# Patient Record
Sex: Female | Born: 1980 | Race: White | Hispanic: No | Marital: Married | Smoking: Former smoker
Health system: Southern US, Community
[De-identification: ages and names within clinical notes are randomized; demographics above are authoritative.]

## PROBLEM LIST (undated history)

## (undated) DIAGNOSIS — R87629 Unspecified abnormal cytological findings in specimens from vagina: Secondary | ICD-10-CM

## (undated) DIAGNOSIS — F32A Depression, unspecified: Secondary | ICD-10-CM

## (undated) DIAGNOSIS — R87619 Unspecified abnormal cytological findings in specimens from cervix uteri: Secondary | ICD-10-CM

## (undated) DIAGNOSIS — F419 Anxiety disorder, unspecified: Secondary | ICD-10-CM

## (undated) DIAGNOSIS — Z6720 Type B blood, Rh positive: Secondary | ICD-10-CM

## (undated) DIAGNOSIS — IMO0002 Reserved for concepts with insufficient information to code with codable children: Secondary | ICD-10-CM

## (undated) DIAGNOSIS — T7840XA Allergy, unspecified, initial encounter: Secondary | ICD-10-CM

## (undated) DIAGNOSIS — Z8619 Personal history of other infectious and parasitic diseases: Secondary | ICD-10-CM

## (undated) DIAGNOSIS — F329 Major depressive disorder, single episode, unspecified: Secondary | ICD-10-CM

## (undated) DIAGNOSIS — K649 Unspecified hemorrhoids: Secondary | ICD-10-CM

## (undated) HISTORY — DX: Type B blood, Rh positive: Z67.20

## (undated) HISTORY — DX: Personal history of other infectious and parasitic diseases: Z86.19

## (undated) HISTORY — DX: Unspecified hemorrhoids: K64.9

## (undated) HISTORY — DX: Depression, unspecified: F32.A

## (undated) HISTORY — DX: Major depressive disorder, single episode, unspecified: F32.9

## (undated) HISTORY — DX: Unspecified abnormal cytological findings in specimens from cervix uteri: R87.619

## (undated) HISTORY — DX: Allergy, unspecified, initial encounter: T78.40XA

## (undated) HISTORY — DX: Anxiety disorder, unspecified: F41.9

## (undated) HISTORY — DX: Unspecified abnormal cytological findings in specimens from vagina: R87.629

---

## 2006-10-29 DIAGNOSIS — R87619 Unspecified abnormal cytological findings in specimens from cervix uteri: Secondary | ICD-10-CM

## 2006-10-29 HISTORY — DX: Unspecified abnormal cytological findings in specimens from cervix uteri: R87.619

## 2007-06-16 ENCOUNTER — Inpatient Hospital Stay (HOSPITAL_COMMUNITY): Admission: AD | Admit: 2007-06-16 | Discharge: 2007-06-19 | Payer: Self-pay | Admitting: Obstetrics and Gynecology

## 2007-11-30 HISTORY — PX: COLPOSCOPY: SHX161

## 2010-01-07 ENCOUNTER — Encounter: Admission: RE | Admit: 2010-01-07 | Discharge: 2010-01-07 | Payer: Self-pay | Admitting: Obstetrics and Gynecology

## 2010-11-09 ENCOUNTER — Encounter: Payer: Self-pay | Admitting: Family Medicine

## 2010-11-29 NOTE — L&D Delivery Note (Signed)
Delivery Note At 4:15 PM a viable and healthy female was delivered via  (Presentation:ROA ).  APGAR:4 ,8, 10 ; weight . 9 pounds and 2 ounces.  Placenta status:spontaneous and intact with 3 vessel Cord:  with the following complications:none .  Cord pH: 7.33. Loose nuchal cord x one reduced on perineum.  Anesthesia:  Epidural Episiotomy: none Lacerations: none Suture Repair: none Est. Blood Loss (mL): 500 Peds in attendance.  Mom to postpartum.  Baby to nursery-stable.  Barnabas Henriques J 10/19/2011, 4:31 PM

## 2010-12-03 ENCOUNTER — Ambulatory Visit
Admission: RE | Admit: 2010-12-03 | Discharge: 2010-12-03 | Payer: Self-pay | Source: Home / Self Care | Attending: Family Medicine | Admitting: Family Medicine

## 2010-12-03 ENCOUNTER — Encounter: Payer: Self-pay | Admitting: Family Medicine

## 2010-12-03 DIAGNOSIS — F411 Generalized anxiety disorder: Secondary | ICD-10-CM | POA: Insufficient documentation

## 2010-12-03 DIAGNOSIS — F418 Other specified anxiety disorders: Secondary | ICD-10-CM | POA: Insufficient documentation

## 2010-12-07 ENCOUNTER — Ambulatory Visit
Admission: RE | Admit: 2010-12-07 | Discharge: 2010-12-07 | Payer: Self-pay | Source: Home / Self Care | Attending: Family Medicine | Admitting: Family Medicine

## 2010-12-07 ENCOUNTER — Encounter: Payer: Self-pay | Admitting: Family Medicine

## 2010-12-07 ENCOUNTER — Encounter (INDEPENDENT_AMBULATORY_CARE_PROVIDER_SITE_OTHER): Payer: Self-pay | Admitting: *Deleted

## 2010-12-07 DIAGNOSIS — G4700 Insomnia, unspecified: Secondary | ICD-10-CM | POA: Insufficient documentation

## 2010-12-10 ENCOUNTER — Encounter
Admission: RE | Admit: 2010-12-10 | Discharge: 2010-12-10 | Payer: Self-pay | Source: Home / Self Care | Attending: Family Medicine | Admitting: Family Medicine

## 2010-12-10 ENCOUNTER — Telehealth: Payer: Self-pay | Admitting: Family Medicine

## 2010-12-10 ENCOUNTER — Encounter: Admission: RE | Admit: 2010-12-10 | Payer: Self-pay | Source: Home / Self Care | Admitting: Family Medicine

## 2010-12-14 ENCOUNTER — Telehealth: Payer: Self-pay | Admitting: Family Medicine

## 2010-12-30 ENCOUNTER — Encounter: Payer: Self-pay | Admitting: Family Medicine

## 2010-12-31 NOTE — Assessment & Plan Note (Signed)
Summary: depression, anxiety   Vital Signs:  Patient profile:   30 year old female Menstrual status:  regular Height:      63 inches Weight:      125 pounds Pulse rate:   88 / minute BP sitting:   114 / 74  (right arm) Cuff size:   regular  Vitals Entered By: Avon Gully CMA, Duncan Dull) (December 07, 2010 10:14 AM) CC: f/u mood,meds not working   Primary Care Provider:  Seymour Bars DO  CC:  f/u mood and meds not working.  History of Present Illness: patient or mood and insomnia.  She was given cervical 5 mg regular release by Dr. Orson Slick.  She never had that filled.  She met with the nurse practitioner who specializes in psychiatry and was given some samples of circle XRT 50 mg to take at bedtime.  She has been taking the Seroquel Xr 50mg  at bedtime. First night slept well and then second night didn't sleep well and then the next day felt better. thus she has been taking a circle for a total of 4 nights.  Two out of 4 she is slept well and two out of 4 she has not slept at all.  She is not really sure that it's calm her anxiety either.  This is the only med medication she is currently taking.   Feels her anxiety is really different from what  it used to be. She is very worried she may have a brain tumor.  she denies any other neurologic symptoms.  She does complain of feeling like she's getting shocking or shooting pains in her head especially at night. Says has never had insomnia until recently.  has tried Lexapr which helped in the past but didn't work this time (took it for 3 months).  Had tried Pristiq which worked well. But had to switch because of cost.  Tried trazodone for sleep but it took 200mg  to work and then would feel worse the next day.  Feels constantly restless.  Stopped everything over Thanksgiving and then tried Buspar.   I did review Dr. Ovidio Kin previous note.  Current Medications (verified): 1)  Klonopin 0.5 Mg Tabs (Clonazepam) .... Take 1-2 Tabs By Mouth At  Bedtime 2)  Seroquel 25 Mg Tabs (Quetiapine Fumarate) .Marland Kitchen.. 1 Tab By Mouth Qpm  Allergies (verified): 1)  ! Lexapro 2)  ! Zoloft 3)  ! Buspar  Comments:  Nurse/Medical Assistant: The patient's medications and allergies were reviewed with the patient and were updated in the Medication and Allergy Lists. Avon Gully CMA, Duncan Dull) (December 07, 2010 10:15 AM)  Physical Exam  General:  Well-developed,well-nourished,in no acute distress; alert,appropriate and cooperative throughout examination Head:  Normocephalic and atraumatic without obvious abnormalities. No apparent alopecia or balding. Lungs:  Normal respiratory effort, chest expands symmetrically. Lungs are clear to auscultation, no crackles or wheezes. Heart:  Normal rate and regular rhythm. S1 and S2 normal without gallop, murmur, click, rub or other extra sounds. Psych:  she is tearful today.  She does make eye contact and appears mildly anxious   Impression & Recommendations:  Problem # 1:  DEPRESSION, SEVERE (ICD-311)  OK for out of work for one week. Can drop off FMLA paperwork Use the klonopin sparingly.discussed abuse potential. PHQ-9 score of 20. GAD- 7 score of 19. she denies being actively suicidal. Asked that she continue the seroquel 50mg  XR and will start pristiq. she did very well on the prostate about two years ago.Samples given  for one week adn will send in rx. Will likely needs prior authorization. I really don't think this will be a problem since she is tried some medications since then. Pt is very concerned about a brain tumor. Dr. Cathey Endow and I tried to reassur her.  test if we can get her set up for a head CT that explained her that her insurance may deny this as she does not have any other significant neurologic symptoms at this time.  She has just had such a dramatic change in her mood without a significant explanation that she is worried this could be something else going on in her  brain l like a tumor Her  updated medication list for this problem includes:    Klonopin 0.5 Mg Tabs (Clonazepam) .Marland Kitchen... Take 1-2 tabs by mouth at bedtime    Pristiq 50 Mg Xr24h-tab (Desvenlafaxine succinate) .Marland Kitchen... Take 1 tablet by mouth once a day  Orders: T-CT Head w/o cm ([address])  Problem # 2:  ANXIETY STATE, UNSPECIFIED (ICD-300.00)  OK for out of work for one week. Can drop off FMLA paperwork Use the klonopin sparingly.discussed abuse potential. PHQ-9 score of 20. GAD- 7 score of 19 Asked that she continue the seroquel 50mg  XR and will start pristiq. she did very well on the prostate about two years ago.Samples given for one week adn will send in rx. Will likely needs prior authorization. I really don't think this will be a problem since she is tried some medications since then. Pt is very concerned about a brain tumor. Dr. Cathey Endow and I tried to reassur her.  test if we can get her set up for a head CT that explained her that her insurance may deny this as she does not have any other significant neurologic symptoms at this time.  She has just had such a dramatic change in her mood without a significant explanation that she is worried this could be something else going on in her  brain l like a tumor Her updated medication list for this problem includes:    Klonopin 0.5 Mg Tabs (Clonazepam) .Marland Kitchen... Take 1-2 tabs by mouth at bedtime    Pristiq 50 Mg Xr24h-tab (Desvenlafaxine succinate) .Marland Kitchen... Take 1 tablet by mouth once a day  Orders: T-CT Head w/o cm ([address])  Complete Medication List: 1)  Klonopin 0.5 Mg Tabs (Clonazepam) .... Take 1-2 tabs by mouth at bedtime 2)  Seroquel Xr 50 Mg Xr24h-tab (Quetiapine fumarate) .... Take 1 tablet by mouth once a day at bedtime 3)  Pristiq 50 Mg Xr24h-tab (Desvenlafaxine succinate) .... Take 1 tablet by mouth once a day  Patient Instructions: 1)  Please schedule a follow-up appointment in 3 weeks.  Prescriptions: PRISTIQ 50 MG XR24H-TAB (DESVENLAFAXINE SUCCINATE) Take 1 tablet by  mouth once a day  #30 x 0   Entered and Authorized by:   Nani Gasser MD   Signed by:   Nani Gasser MD on 12/07/2010   Method used:   Electronically to        [facility] [address] (retail)       [address]       [place], [place]  [address]       Ph: [phone]       Fax: [phone]   RxID:   [id number]    Orders Added: 1)  T-CT Head w/o cm [[address]] 2)  Est. Patient Level IV [[address]253]

## 2010-12-31 NOTE — Letter (Signed)
Summary: Out of Work  [facility]  [address], [address]   [place], [place] [address]   Phone: [phone]  Fax: [phone]    December 07, 2010   Employee:  Holly Medina    To Whom It May Concern:   For Medical reasons, please excuse the above named employee from work for the following dates:  Start:   12/07/10  End:   12/07/10  If you need additional information, please feel free to contact our office.         Sincerely,    Nani Gasser, MD

## 2010-12-31 NOTE — Letter (Signed)
Summary: Anxiety & Depression Questionnaire  Anxiety & Depression Questionnaire   Imported By: Lanelle Bal 12/23/2010 10:54:12  _____________________________________________________________________  External Attachment:    Type:   Image     Comment:   External Document

## 2010-12-31 NOTE — Progress Notes (Signed)
Summary: ? about Seroquel  Phone Note Call from Patient Call back at Home Phone [phone]   Caller: Patient Summary of Call: Pt states she is taking Seroquel 50mg  as prescribed, but it is only working every other night to help her sleep.  Pt states she took a klonipin she had on hand last night to help her sleep.  Pt would like to know what next step is since current dose of Seroquel is not working. Initial call taken by: Francee Piccolo CMA Duncan Dull),  December 14, 2010 11:19 AM  Follow-up for Phone Call        is she taking the Pristiq in the morning? If so, go ahead and increase Seroquel to 1 and a half tabs in the evening for a wk.  If still not sleeping everynight, go up to 2 tabs in the evening until her f/u appt with me.  If needs RF sooner, let me know.  Follow-up by: Seymour Bars DO,  December 14, 2010 11:32 AM  Additional Follow-up for Phone Call Additional follow up Details #1::        Pt states she is taking Pristiq in the morning.  She will increase to 1 and a half tablets in the evening to see if that will help her sleep everynight.  Pt understands to increase to 2 tablets nightly if 1 and a half tablets is not working.  Pt will call for refill if needed prior to follow up.  Additional Follow-up by: Francee Piccolo CMA Duncan Dull),  December 14, 2010 11:44 AM    New/Updated Medications: SEROQUEL 50 MG TABS (QUETIAPINE FUMARATE) 1.5 tabs by mouth qPM x 1 wk then increase to 2 tabs by mouth PM

## 2010-12-31 NOTE — Progress Notes (Signed)
Summary: ? on Seroquel  Phone Note Call from Patient Call back at Home Phone [phone]   Caller: Patient Call For: Seymour Bars DO Summary of Call: Pt states the Seroquel XR 50mg  samples do not seem to be working for her but the plain Seroquel 25mg  that you gave her first worked good. [facility]ses [facility] [address]. Pt states didn't know if you wanted to increase the extended release or what to do. Please advise Initial call taken by: Kathlene November LPN,  December 10, 2010 1:18 PM  Follow-up for Phone Call        I changed her to plain Seroquel 50 mg at night from the XR version.  Pls stop the XR version.   Follow-up by: Seymour Bars DO,  December 10, 2010 1:47 PM    New/Updated Medications: SEROQUEL 50 MG TABS (QUETIAPINE FUMARATE) 1 tab by mouth qPM Prescriptions: SEROQUEL 50 MG TABS (QUETIAPINE FUMARATE) 1 tab by mouth qPM  #60 x 1   Entered and Authorized by:   Seymour Bars DO   Signed by:   Seymour Bars DO on 12/10/2010   Method used:   Electronically to        [facility] [address] (retail)       [address]       [place], [place]  [address]       Ph: [phone]       Fax: [phone]   RxID:   [id number]   Appended Document: ? on Seroquel 12/10/2010- Pt notified of above instructions. KJ LPN

## 2010-12-31 NOTE — Letter (Signed)
Summary: Anxiety & Depression Questionnaire  Anxiety & Depression Questionnaire   Imported By: Lanelle Bal 12/18/2010 10:25:55  _____________________________________________________________________  External Attachment:    Type:   Image     Comment:   External Document

## 2010-12-31 NOTE — Assessment & Plan Note (Signed)
Summary: NOV mood   Vital Signs:  Patient profile:   30 year old female Menstrual status:  regular LMP:     11/09/2010 Height:      63 inches Weight:      126 pounds BMI:     22.40 O2 Sat:      98 % on Room air Temp:     98.3 degrees F oral Pulse rate:   68 / minute BP sitting:   109 / 66  (left arm) Cuff size:   regular  Vitals Entered By: Payton Spark CMA (December 03, 2010 9:49 AM)  O2 Flow:  Room air CC: New to est. Discuss insomnia, anxiety, restlessness and HAs LMP (date): 11/09/2010     Menstrual Status regular Enter LMP: 11/09/2010   Primary Care Provider:  Seymour Bars DO  CC:  New to est. Discuss insomnia, anxiety, and restlessness and HAs.  History of Present Illness: 30 yo WF presents for NOV.  She is a previously healthy G1P1.  She was treated for depression with Lexapro about 7 yrs ago for 1 yr and did well.  Postpartum, she was treated again with Lexapro which was then changed to Pristiq which worked even better but she could not afford the co pay.  She has been feeling depressed and anxious since Aug, shortly after moving to a new house.  Denies any other acute stressors.  She has not been sleeping well.  She went back to her previous PMD and was put back on Lexapro but it did not seem to help.  She then saw Donnie Aho, psychiatric NP and was tried on Buspar and the Zoloft which seemed to make her anxiety and insomnia worse.  She has a supportive husband and a 3 yr daughter, both doing well.  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  She screened neg for a thyroid d/o in Aug.  She is getting tension headaches from neck tightness.  Denies vision change, wt loss or vomitting.  Has slight nausea.  She is off OCPs in the hopes of having a 2nd child.  Periods are regular q 35 days.    she is currently taking Klonoopin at night which helps some but she says that she needs > 1 mg to rest.    Habits & Providers  Alcohol-Tobacco-Diet     Alcohol drinks/day:  <1     Tobacco Status: quit > 6 months     Year Quit: 2004  Exercise-Depression-Behavior     Have you felt down or hopeless? yes     STD Risk: never     Drug Use: never     Seat Belt Use: always  Current Medications (verified): 1)  Klonopin 0.5 Mg Tabs (Clonazepam) .... Take 1-2 Tabs By Mouth At Bedtime  Allergies (verified): 1)  ! Lexapro 2)  ! Zoloft 3)  ! 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  Has a 8 yo daughter. Quit smoking in 04. Used to exercise 1 ETOH/ wk Denies hx of drug use.Smoking Status:  quit > 6 months STD Risk:  never Drug Use:  never Seat Belt Use:  always  Review of Systems       no fevers/sweats/weakness, unexplained wt loss/gain, no change in vision, no difficulty hearing, ringing in ears, no hay fever/allergies, no CP/discomfort,  no palpitations, no breast lump/nipple discharge, no cough/wheeze, no blood in stool, no N/V/D, no nocturia, no leaking urine, no unusual vag bleeding, no vaginal/penile discharge, no muscle/joint pain, no rash, no new/changing mole, + HA, + memory loss, + anxiety, +sleep problem, + depression, no unexplained lumps, no easy bruising/bleeding, no concern with sexual function   Physical Exam  General:  alert, well-developed, well-nourished, and well-hydrated.   Head:  normocephalic and atraumatic.   Mouth:  good dentition and pharynx pink and moist.   Neck:  no masses.   Lungs:  normal respiratory effort, no intercostal retractions, and no accessory muscle use.   Heart:  normal rate, regular rhythm, and no murmur.   Extremities:  no pretibial edema Neurologic:  no tremor Skin:  color normal.   Psych:  good eye contact, depressed affect, tearful, and slightly anxious.     Impression &  Recommendations:  Problem # 1:  DEPRESSION, SEVERE (ICD-311) PHQ 9 score of 23 c/w severe depression.  Pt has tried mulitple SSRIs and 1 SNRI with SE of insomnia.  She is trying to concieve now. She will continue counseling.  Will work on treating her anxiety and insomnia (see below).  Pt verbally contracts that she will not try to harm herself or orthers and will call me for any problems.  RTC in 1 month for f/u.  May add Fluoxetine at that time. Her updated medication list for this problem includes:    Klonopin 0.5 Mg Tabs (Clonazepam) .Marland Kitchen... Take 1-2 tabs by mouth at bedtime  Problem # 2:  ANXIETY STATE, UNSPECIFIED (ICD-300.00) GAD score16 c/w severe anxiety with associated insomnia and HAs. Will start Seroquel at night for severe anxiety/ poor sleep/ resistant depression. Will start with 1/4 tab x 1 wk then increase to 1/2 tab x 1 wk then to 1 tab by mouth PM (at 7 pm).  Cautioned about sleepiniess. Will need to monitor wt, fasting glucose and lipids.  RTC in 4 wks for f/u.  Stop if + UPT. Her updated medication list for this problem includes:    Klonopin 0.5 Mg Tabs (Clonazepam) .Marland Kitchen... Take 1-2 tabs by mouth at bedtime  Complete Medication List: 1)  Klonopin 0.5 Mg Tabs (Clonazepam) .... Take 1-2 tabs by mouth at bedtime 2)  Seroquel 25 Mg Tabs (Quetiapine fumarate) .Marland Kitchen.. 1 tab by mouth qpm  Patient Instructions: 1)  Start SEROQUEL for anxiety and Insomnia. 2)  Stop the Klonopin. 3)  Take 1/4 tab of Seroquel at 7 pm for the first wk then go up to 1/2 tab of Seroquel at 7 pm for the 2nd wk.  If needed (and not causing too much daytime sedation), go up to a full tab at 7 pm during week 3. 4)  Call me if any problems. 5)  Stop if you get a + pregnancy test. 6)  REturn for follow up with labs in 4 wks. Prescriptions: SEROQUEL 25 MG TABS (QUETIAPINE FUMARATE) 1 tab by mouth qPM  #30 x 1   Entered and Authorized by:   Seymour Bars DO   Signed by:   Seymour Bars DO on 12/03/2010   Method  used:   Electronically to        [facility] [address] (retail)       [address]       [place], [place]  [address]       Ph: [phone]  Fax: [phone]   RxID:   [id number]    Orders Added: 1)  New Patient Level III [[address]]

## 2011-01-01 ENCOUNTER — Ambulatory Visit (INDEPENDENT_AMBULATORY_CARE_PROVIDER_SITE_OTHER): Payer: BC Managed Care – PPO | Admitting: Family Medicine

## 2011-01-01 ENCOUNTER — Ambulatory Visit: Admit: 2011-01-01 | Payer: Self-pay | Admitting: Family Medicine

## 2011-01-01 ENCOUNTER — Encounter: Payer: Self-pay | Admitting: Family Medicine

## 2011-01-01 DIAGNOSIS — F411 Generalized anxiety disorder: Secondary | ICD-10-CM

## 2011-01-01 DIAGNOSIS — F3289 Other specified depressive episodes: Secondary | ICD-10-CM

## 2011-01-01 DIAGNOSIS — F329 Major depressive disorder, single episode, unspecified: Secondary | ICD-10-CM

## 2011-01-06 NOTE — Miscellaneous (Signed)
Summary: old records  Clinical Lists Changes  Observations: Added new observation of PAST MED HX: depression/ anxiety- has tried zoloft, lexapro, pristiq and effexor, celexa, trazadone, xanax, klonopin (12/30/2010 9:57) Added new observation of PRIMARY MD: Seymour Bars DO (12/30/2010 9:57)       Past History:  Past Medical History: depression/ anxiety- has tried zoloft, lexapro, pristiq and effexor, celexa, trazadone, xanax, klonopin

## 2011-01-06 NOTE — Assessment & Plan Note (Signed)
Summary: f/u mood   Vital Signs:  Patient profile:   30 year old female Menstrual status:  regular Height:      63 inches Weight:      130 pounds BMI:     23.11 O2 Sat:      99 % on Room air Pulse rate:   82 / minute BP sitting:   114 / 72  (left arm) Cuff size:   regular  Vitals Entered By: Payton Spark CMA (January 01, 2011 3:55 PM)  O2 Flow:  Room air CC: F/U mood.   Primary Care Provider:  Seymour Bars DO  CC:  F/U mood.Holly Medina  History of Present Illness: 30 yo WF presents for f/u depression and anxiety.  She is doing better.  She is currently on Seroquel 50 mg in the evening and Pristiq 50 mg in the AM, rarely using klonopin and in counseling in [place] 1 x a wk.  She is making it to work and feeling more like herself.  Has decided to wait to try to conceive.  Husband notices a difference.  Still a bit anxious but is sleeping much better.  Still feels slightly shakey but improved.  Denies any suicidial ideations.    Current Medications (verified): 1)  Seroquel 50 Mg Tabs (Quetiapine Fumarate) .... 1.5 Tabs By Mouth Qpm X 1 Wk Then Increase To 2 Tabs By Mouth Pm 2)  Pristiq 50 Mg Xr24h-Tab (Desvenlafaxine Succinate) .... Take 1 Tablet By Mouth Once A Day  Allergies (verified): 1)  ! Lexapro 2)  ! Zoloft 3)  ! Buspar  Past History:  Past Medical History: Reviewed history from 12/30/2010 and no changes required. depression/ anxiety- has tried zoloft, lexapro, pristiq and effexor, celexa, trazadone, xanax, klonopin  Past Surgical History: Reviewed history from 12/03/2010 and no changes required. none  Social History: Reviewed history from 12/03/2010 and no changes required. 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  Has a 60 yo daughter. Quit smoking in 04. Used to exercise 1 ETOH/ wk Denies hx of drug use.  Review of Systems Psych:  Complains of anxiety and depression; denies alternate hallucination ( auditory/visual), easily  tearful, irritability, panic attacks, suicidal thoughts/plans, thoughts of violence, and unusual visions or sounds.  Physical Exam  General:  alert, well-developed, well-nourished, and well-hydrated.   Head:  normocephalic and atraumatic.   Neck:  no masses.   Lungs:  Normal respiratory effort, chest expands symmetrically. Lungs are clear to auscultation, no crackles or wheezes. Heart:  Normal rate and regular rhythm. S1 and S2 normal without gallop, murmur, click, rub or other extra sounds. Skin:  color normal.   Psych:  good eye contact, not depressed appearing, and slightly anxious.     Impression & Recommendations:  Problem # 1:  ANXIETY STATE, UNSPECIFIED (ICD-300.00) Improved but not quite at goal.  Sleep has improved wtih Seroquel and so has her anxiety but she still scored a 10 on a GAD 7 today. Will ask Dr Christell Constant to see her re: med mgmt.   The following medications were removed from the medication list:    Klonopin 0.5 Mg Tabs (Clonazepam) .Holly Medina... Take 1-2 tabs by mouth at bedtime Her updated medication list for this problem includes:    Pristiq 50 Mg Xr24h-tab (Desvenlafaxine succinate) .Holly Medina... Take 1 tablet by mouth once a day    Klonopin 1 Mg Tabs (Clonazepam) .Holly Medina... 1/2 to 1 tab by mouth two times a day as needed anxiety  Problem #  2:  DEPRESSION, SEVERE (ICD-311) PHQ 9 score is 6, much improved!  Will continue Pristiq at current dose along with counseling. The following medications were removed from the medication list:    Klonopin 0.5 Mg Tabs (Clonazepam) .Holly Medina... Take 1-2 tabs by mouth at bedtime Her updated medication list for this problem includes:    Pristiq 50 Mg Xr24h-tab (Desvenlafaxine succinate) .Holly Medina... Take 1 tablet by mouth once a day    Klonopin 1 Mg Tabs (Clonazepam) .Holly Medina... 1/2 to 1 tab by mouth two times a day as needed anxiety  Complete Medication List: 1)  Seroquel 50 Mg Tabs (Quetiapine fumarate) .Holly Medina.. 1 tab by mouth at bedtime 2)  Pristiq 50 Mg Xr24h-tab  (Desvenlafaxine succinate) .... Take 1 tablet by mouth once a day 3)  Klonopin 1 Mg Tabs (Clonazepam) .... 1/2 to 1 tab by mouth two times a day as needed anxiety  Other Orders: T-Comprehensive Metabolic Panel [id number]) T-Lipid Profile ([id number])  Patient Instructions: 1)  Stay on current meds, RFd. 2)  Update fasting labs the end of Feb, downstairs. 3)  Will call you w/ results. 4)  Continue counseling. 5)  Will put in for referral to Dr Christell Constant for med managment for anxiety. 6)  Return for f/u in 2 mos. Prescriptions: PRISTIQ 50 MG XR24H-TAB (DESVENLAFAXINE SUCCINATE) Take 1 tablet by mouth once a day  #30 x 3   Entered and Authorized by:   Seymour Bars DO   Signed by:   Seymour Bars DO on 01/01/2011   Method used:   Electronically to        [facility] [address] [phone]* (retail)       [address] [place], [place]  [address]       Ph: [phone]       Fax: [phone]   RxID:   [id number] SEROQUEL 50 MG TABS (QUETIAPINE FUMARATE) 1 tab by mouth at bedtime  #30 x 3   Entered and Authorized by:   Seymour Bars DO   Signed by:   Seymour Bars DO on 01/01/2011   Method used:   Electronically to        [facility] [id number] (retail)       [address] [place], [place]  [address]       Ph: [phone]       Fax: [phone]   RxID:   [id number]    Orders Added: 1)  T-Comprehensive Metabolic Panel [[id number]] 2)  T-Lipid Profile [[id number]] 3)  Est. Patient Level III [10175]

## 2011-01-13 ENCOUNTER — Telehealth: Payer: Self-pay | Admitting: Family Medicine

## 2011-01-14 NOTE — Letter (Signed)
Summary: Records from [facility] 2011  Records from [facility] 2011   Imported By: Maryln Gottron 01/06/2011 12:55:38  _____________________________________________________________________  External Attachment:    Type:   Image     Comment:   External Document

## 2011-01-20 NOTE — Progress Notes (Signed)
Summary: [facility]-Refill Klonopin  Phone Note Call from Patient Call back at Home Phone [phone]   Caller: Patient Call For: Seymour Bars DO Reason for Call: Talk to Nurse Details for Reason: Question about med Summary of Call: RC to pt.  LM to RC at home number  Initial call taken by: Francee Piccolo CMA Duncan Dull),  January 13, 2011 3:08 PM  Follow-up for Phone Call        pt was given klonopin by previous practice.  She is almost out and uses occasionally. Pt would like refill called to [facility]. Follow-up by: Francee Piccolo CMA Duncan Dull),  January 13, 2011 4:07 PM    Prescriptions: KLONOPIN 1 MG TABS (CLONAZEPAM) 1/2 to 1 tab by mouth two times a day as needed anxiety  #30 x 0   Entered and Authorized by:   Seymour Bars DO   Signed by:   Seymour Bars DO on 01/13/2011   Method used:   Print then Give to Patient   RxID:   [id number]   Appended Document: [facility]-Refill Klonopin Rx faxed. Pt awrae

## 2011-01-26 NOTE — Letter (Signed)
Summary: Depression & Anxiety Questionnaires  Depression & Anxiety Questionnaires   Imported By: Lanelle Bal 01/21/2011 11:17:09  _____________________________________________________________________  External Attachment:    Type:   Image     Comment:   External Document

## 2011-02-09 ENCOUNTER — Encounter: Payer: Self-pay | Admitting: Family Medicine

## 2011-03-11 ENCOUNTER — Encounter: Payer: Self-pay | Admitting: Family Medicine

## 2011-03-12 ENCOUNTER — Ambulatory Visit: Payer: BC Managed Care – PPO | Admitting: Family Medicine

## 2011-04-13 NOTE — H&P (Signed)
NAME:  Holly Medina, Holly Medina         ACCOUNT NO.:  [id number]   MEDICAL RECORD NO.:  [id number]          PATIENT TYPE:  INP   LOCATION:  9169                          FACILITY:  [facility]   PHYSICIAN:  Lenoard Aden, M.D.DATE OF BIRTH:  1981/08/14   DATE OF ADMISSION:  06/16/2007  DATE OF DISCHARGE:                              HISTORY & PHYSICAL   CHIEF COMPLAINT:  Presumed macrosomia for induction at 39 weeks.   She is a 30 year old white female, G1, P0, EDD of June 24, 2007, at 8  weeks' gestation who presents for cervical ripening and induction.   She has no known drug allergies.   MEDICATIONS:  Prenatal vitamins.   FAMILY HISTORY:  Multiple sclerosis, lung cancer, and chronic  hypertension.   She is a nonsmoker, nondrinker, she denies domestic or physical  violence.   She has a noncontributory surgical history.   PREGNANCY COURSE:  Complicated by size/dates discrepancy and Group B  strep positivity.   PHYSICAL EXAMINATION:  GENERAL:  She is a well-developed, well-  nourished, white female in no acute distress.  HEENT:  Normal.  LUNGS:  Clear.  HEART:  Regular rhythm.  ABDOMEN:  Soft, gravid, nontender.  Estimated fetal weight of 8.5  pounds.  PELVIC:  Cervix is 2-cm, thick, vertex, minus 1.  EXTREMITIES:  Reveal no cords.  NEUROLOGIC:  Nonfocal.  SKIN:  Intact.   Cervidil is placed.  NST is reactive.   IMPRESSION:  1. A 39-week intrauterine pregnancy.  2. Presumed macrosomia for induction.   PLAN:  Proceed with cervical ripening, induction, anticipate attempt at  vaginal delivery.  We will perform antepartum prophylaxis for Group B  strep.      Lenoard Aden, M.D.  Electronically Signed     RJT/MEDQ  D:  06/16/2007  T:  06/16/2007  Job:  [id number]

## 2011-05-11 ENCOUNTER — Other Ambulatory Visit (HOSPITAL_COMMUNITY): Payer: Self-pay | Admitting: Obstetrics and Gynecology

## 2011-05-26 ENCOUNTER — Ambulatory Visit (HOSPITAL_COMMUNITY)
Admission: RE | Admit: 2011-05-26 | Discharge: 2011-05-26 | Disposition: A | Discharge status: Home / Self Care | Payer: BC Managed Care – PPO | Source: Ambulatory Visit | Attending: Obstetrics and Gynecology | Admitting: Obstetrics and Gynecology

## 2011-05-26 ENCOUNTER — Ambulatory Visit (HOSPITAL_COMMUNITY): Admission: RE | Admit: 2011-05-26 | Payer: BC Managed Care – PPO | Source: Ambulatory Visit

## 2011-05-26 DIAGNOSIS — O358XX0 Maternal care for other (suspected) fetal abnormality and damage, not applicable or unspecified: Secondary | ICD-10-CM | POA: Insufficient documentation

## 2011-05-26 DIAGNOSIS — Z363 Encounter for antenatal screening for malformations: Secondary | ICD-10-CM | POA: Insufficient documentation

## 2011-05-26 DIAGNOSIS — Z1389 Encounter for screening for other disorder: Secondary | ICD-10-CM | POA: Insufficient documentation

## 2011-09-13 LAB — CBC
Hemoglobin: 12.3
MCHC: 34
MCV: 91.4
MCV: 92.5
Platelets: 217
RBC: 3.96
RDW: 12.9
RDW: 13
WBC: 11.4 — ABNORMAL HIGH

## 2011-10-18 ENCOUNTER — Encounter (HOSPITAL_COMMUNITY): Payer: Self-pay | Admitting: *Deleted

## 2011-10-18 ENCOUNTER — Inpatient Hospital Stay (HOSPITAL_COMMUNITY)
Admission: RE | Admit: 2011-10-18 | Discharge: 2011-10-21 | DRG: 373 | Disposition: A | Discharge status: Home / Self Care | Payer: BC Managed Care – PPO | Source: Ambulatory Visit | Attending: Obstetrics and Gynecology | Admitting: Obstetrics and Gynecology

## 2011-10-18 DIAGNOSIS — O99892 Other specified diseases and conditions complicating childbirth: Secondary | ICD-10-CM | POA: Diagnosis present

## 2011-10-18 DIAGNOSIS — O3660X0 Maternal care for excessive fetal growth, unspecified trimester, not applicable or unspecified: Secondary | ICD-10-CM | POA: Diagnosis present

## 2011-10-18 DIAGNOSIS — Z22330 Carrier of Group B streptococcus: Secondary | ICD-10-CM

## 2011-10-18 DIAGNOSIS — O409XX0 Polyhydramnios, unspecified trimester, not applicable or unspecified: Principal | ICD-10-CM | POA: Diagnosis present

## 2011-10-18 HISTORY — DX: Reserved for concepts with insufficient information to code with codable children: IMO0002

## 2011-10-18 LAB — CBC
HCT: 36.1 % (ref 36.0–46.0)
MCH: 29.1 pg (ref 26.0–34.0)
Platelets: 262 10*3/uL (ref 150–400)
RDW: 14.7 % (ref 11.5–15.5)

## 2011-10-18 LAB — ABO/RH: RH Type: POSITIVE

## 2011-10-18 LAB — ANTIBODY SCREEN: Antibody Screen: NEGATIVE

## 2011-10-18 LAB — HIV ANTIBODY (ROUTINE TESTING W REFLEX): HIV: NONREACTIVE

## 2011-10-18 MED ORDER — OXYTOCIN 20 UNITS IN LACTATED RINGERS INFUSION - SIMPLE
1.0000 m[IU]/min | INTRAVENOUS | Status: DC
  Administered 2011-10-19: 2 m[IU]/min via INTRAVENOUS
  Filled 2011-10-18: qty 1000

## 2011-10-18 MED ORDER — DINOPROSTONE 10 MG VA INST
10.0000 mg | VAGINAL_INSERT | Freq: Once | VAGINAL | Status: AC
  Administered 2011-10-18: 10 mg via VAGINAL
  Filled 2011-10-18: qty 1

## 2011-10-18 MED ORDER — TERBUTALINE SULFATE 1 MG/ML IJ SOLN: 0.2500 mg | Freq: Once | INTRAMUSCULAR | Status: AC | PRN

## 2011-10-18 MED ORDER — LIDOCAINE HCL (PF) 1 % IJ SOLN: 30.0000 mL | INTRAMUSCULAR | Status: DC | PRN

## 2011-10-18 MED ORDER — LACTATED RINGERS IV SOLN
500.0000 mL | INTRAVENOUS | Status: DC | PRN
  Administered 2011-10-19: 999 mL via INTRAVENOUS

## 2011-10-18 MED ORDER — PENICILLIN G POTASSIUM 5000000 UNITS IJ SOLR
5.0000 10*6.[IU] | Freq: Once | INTRAVENOUS | Status: AC
  Administered 2011-10-19: 5 10*6.[IU] via INTRAVENOUS
  Filled 2011-10-18: qty 5

## 2011-10-18 MED ORDER — ACETAMINOPHEN 325 MG PO TABS: 650.0000 mg | ORAL_TABLET | ORAL | Status: DC | PRN

## 2011-10-18 MED ORDER — OXYTOCIN 20 UNITS IN LACTATED RINGERS INFUSION - SIMPLE
125.0000 mL/h | Freq: Once | INTRAVENOUS | Status: AC
  Administered 2011-10-19: 999 mL/h via INTRAVENOUS

## 2011-10-18 MED ORDER — CITRIC ACID-SODIUM CITRATE 334-500 MG/5ML PO SOLN: 30.0000 mL | ORAL | Status: DC | PRN

## 2011-10-18 MED ORDER — PENICILLIN G POTASSIUM 5000000 UNITS IJ SOLR
2.5000 10*6.[IU] | INTRAVENOUS | Status: DC
  Administered 2011-10-19 (×2): 2.5 10*6.[IU] via INTRAVENOUS
  Filled 2011-10-18 (×5): qty 2.5

## 2011-10-18 MED ORDER — PENICILLIN G POTASSIUM 5000000 UNITS IJ SOLR: 2.5000 10*6.[IU] | INTRAVENOUS | Status: DC

## 2011-10-18 MED ORDER — ZOLPIDEM TARTRATE 10 MG PO TABS
10.0000 mg | ORAL_TABLET | Freq: Every evening | ORAL | Status: DC | PRN
  Administered 2011-10-18: 10 mg via ORAL
  Filled 2011-10-18: qty 1

## 2011-10-18 MED ORDER — ONDANSETRON HCL 4 MG/2ML IJ SOLN: 4.0000 mg | Freq: Four times a day (QID) | INTRAMUSCULAR | Status: DC | PRN

## 2011-10-18 MED ORDER — OXYTOCIN BOLUS FROM INFUSION
500.0000 mL | Freq: Once | INTRAVENOUS | Status: DC
  Filled 2011-10-18: qty 500

## 2011-10-18 MED ORDER — FLEET ENEMA 7-19 GM/118ML RE ENEM: 1.0000 | ENEMA | RECTAL | Status: DC | PRN

## 2011-10-18 MED ORDER — IBUPROFEN 600 MG PO TABS
600.0000 mg | ORAL_TABLET | Freq: Four times a day (QID) | ORAL | Status: DC | PRN
  Administered 2011-10-19: 600 mg via ORAL
  Filled 2011-10-18: qty 1

## 2011-10-18 MED ORDER — LACTATED RINGERS IV SOLN
INTRAVENOUS | Status: DC
  Administered 2011-10-19: 999 mL/h via INTRAVENOUS
  Administered 2011-10-19 (×2): via INTRAVENOUS

## 2011-10-18 MED ORDER — OXYCODONE-ACETAMINOPHEN 5-325 MG PO TABS: 2.0000 | ORAL_TABLET | ORAL | Status: DC | PRN

## 2011-10-18 NOTE — H&P (Signed)
NAMEMarland Medina  NANDI, TONNESEN NO.:  [id number]  MEDICAL RECORD NO.:  [id number]  LOCATION:  9173                          FACILITY:  [facility]  PHYSICIAN:  Lenoard Aden, M.D.DATE OF BIRTH:  1981-09-04  DATE OF ADMISSION:  10/18/2011 DATE OF DISCHARGE:                             HISTORY & PHYSICAL   CHIEF COMPLAINT:  Polyhydramnios with presumed LGA for induction.  HISTORY OF PRESENT ILLNESS:  A 30 year old white female, G2, P1, at 65 weeks' gestation admitted for aforementioned indications.  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  No other medical issues.  Medications are Zoloft and prenatal vitamins.  She is a nonsmoker, nondrinker.  She denies domestic or physical violence.  She has a family history of hypertension, multiple sclerosis, and lung cancer.  She has a history of an 8 pounds 7 ounce vaginal delivery in 2008.  PRENATAL COURSE:  Complicated by polyhydramnios with an AFI greater than 25 and presumed LGA with an estimated fetal weight greater than the 90th percentile and GBS positive.  PHYSICAL EXAMINATION:  GENERAL:  She is a well-developed, well- nourished, white female, in no acute distress. HEENT:  Normal. NECK:  Supple.  Full range of motion. LUNGS:  Clear. HEART:  Regular rhythm. ABDOMEN:  Soft, gravid, nontender.  Estimated fetal weight 9 pounds. Cervix:  1-2 cm, 50%, vertex, -1. EXTREMITIES:  No cords. NEUROLOGIC:  Nonfocal. SKIN:  Intact.  IMPRESSION:  This 39 weeks' intrauterine pregnancy with polyhydramnios, LGA, and GBS positive for induction.  PLAN:  Proceed with Cervidil and Pitocin in a.m.     Lenoard Aden, M.D.     RJT/MEDQ  D:  10/18/2011  T:  10/18/2011  Job:  [id number]

## 2011-10-18 NOTE — Progress Notes (Signed)
Holly Medina is a 30 y.o. G2P1001 at [redacted]w[redacted]d by LMP admitted for induction of labor due to Hydramnios.  Subjective:   Objective: BP 107/70  Pulse 96  Temp(Src) 97.8 F (36.6 C) (Oral)  Resp 18  Ht 5\' 3"  (1.6 m)  Wt 82.555 kg (182 lb)  BMI 32.24 kg/m2  LMP 01/18/2011      FHT:  FHR: 155 bpm, variability: moderate,  accelerations:  Present,  decelerations:  Absent UC:   irregular, every 10 minutes SVE:      Labs: Lab Results  Component Value Date   WBC 9.9 10/18/2011   HGB 11.9* 10/18/2011   HCT 36.1 10/18/2011   MCV 88.3 10/18/2011   PLT 262 10/18/2011    Assessment / Plan: 39 weeks Polyhydramnios LGA GBS positive  Labor: cervidil in Preeclampsia:  na Fetal Wellbeing:  Category I Pain Control:  Labor support without medications I/D:  n/a Anticipated MOD:  NSVD [id number]  Holly Medina J 10/18/2011, 9:11 PM

## 2011-10-19 ENCOUNTER — Encounter (HOSPITAL_COMMUNITY): Payer: Self-pay | Admitting: Anesthesiology

## 2011-10-19 ENCOUNTER — Encounter (HOSPITAL_COMMUNITY): Payer: Self-pay | Admitting: *Deleted

## 2011-10-19 ENCOUNTER — Inpatient Hospital Stay (HOSPITAL_COMMUNITY): Payer: BC Managed Care – PPO | Admitting: Anesthesiology

## 2011-10-19 LAB — RPR: RPR Ser Ql: NONREACTIVE

## 2011-10-19 MED ORDER — TETANUS-DIPHTH-ACELL PERTUSSIS 5-2.5-18.5 LF-MCG/0.5 IM SUSP
0.5000 mL | Freq: Once | INTRAMUSCULAR | Status: AC
  Administered 2011-10-20: 0.5 mL via INTRAMUSCULAR
  Filled 2011-10-19: qty 0.5

## 2011-10-19 MED ORDER — PHENYLEPHRINE 40 MCG/ML (10ML) SYRINGE FOR IV PUSH (FOR BLOOD PRESSURE SUPPORT)
80.0000 ug | PREFILLED_SYRINGE | INTRAVENOUS | Status: DC | PRN
  Filled 2011-10-19: qty 5

## 2011-10-19 MED ORDER — WITCH HAZEL-GLYCERIN EX PADS: 1.0000 "application " | MEDICATED_PAD | CUTANEOUS | Status: DC | PRN

## 2011-10-19 MED ORDER — OXYTOCIN 20 UNITS IN LACTATED RINGERS INFUSION - SIMPLE: 125.0000 mL/h | INTRAVENOUS | Status: DC

## 2011-10-19 MED ORDER — LIDOCAINE HCL 1.5 % IJ SOLN
INTRAMUSCULAR | Status: DC | PRN
  Administered 2011-10-19: 2 mL via INTRADERMAL
  Administered 2011-10-19 (×2): 5 mL via INTRADERMAL

## 2011-10-19 MED ORDER — PRENATAL PLUS 27-1 MG PO TABS
1.0000 | ORAL_TABLET | Freq: Every day | ORAL | Status: DC
  Administered 2011-10-20 – 2011-10-21 (×2): 1 via ORAL
  Filled 2011-10-19 (×2): qty 1

## 2011-10-19 MED ORDER — ONDANSETRON HCL 4 MG/2ML IJ SOLN: 4.0000 mg | INTRAMUSCULAR | Status: DC | PRN

## 2011-10-19 MED ORDER — FENTANYL 2.5 MCG/ML BUPIVACAINE 1/10 % EPIDURAL INFUSION (WH - ANES)
14.0000 mL/h | INTRAMUSCULAR | Status: DC
  Administered 2011-10-19: 14 mL/h via EPIDURAL
  Filled 2011-10-19 (×2): qty 60

## 2011-10-19 MED ORDER — FENTANYL 2.5 MCG/ML BUPIVACAINE 1/10 % EPIDURAL INFUSION (WH - ANES)
INTRAMUSCULAR | Status: DC | PRN
  Administered 2011-10-19: 14 mL/h via EPIDURAL

## 2011-10-19 MED ORDER — EPHEDRINE 5 MG/ML INJ
10.0000 mg | INTRAVENOUS | Status: DC | PRN
  Administered 2011-10-19: 10 mg via INTRAVENOUS
  Filled 2011-10-19: qty 4

## 2011-10-19 MED ORDER — EPHEDRINE 5 MG/ML INJ: 10.0000 mg | INTRAVENOUS | Status: DC | PRN

## 2011-10-19 MED ORDER — ONDANSETRON HCL 4 MG PO TABS: 4.0000 mg | ORAL_TABLET | ORAL | Status: DC | PRN

## 2011-10-19 MED ORDER — METHYLERGONOVINE MALEATE 0.2 MG/ML IJ SOLN: 0.2000 mg | INTRAMUSCULAR | Status: DC | PRN

## 2011-10-19 MED ORDER — DIBUCAINE 1 % RE OINT: 1.0000 "application " | TOPICAL_OINTMENT | RECTAL | Status: DC | PRN

## 2011-10-19 MED ORDER — LACTATED RINGERS IV SOLN: 500.0000 mL | Freq: Once | INTRAVENOUS | Status: DC

## 2011-10-19 MED ORDER — IBUPROFEN 600 MG PO TABS
600.0000 mg | ORAL_TABLET | Freq: Four times a day (QID) | ORAL | Status: DC
  Administered 2011-10-19 – 2011-10-21 (×6): 600 mg via ORAL
  Filled 2011-10-19 (×6): qty 1

## 2011-10-19 MED ORDER — METHYLERGONOVINE MALEATE 0.2 MG PO TABS: 0.2000 mg | ORAL_TABLET | ORAL | Status: DC | PRN

## 2011-10-19 MED ORDER — SERTRALINE HCL 50 MG PO TABS
150.0000 mg | ORAL_TABLET | Freq: Every day | ORAL | Status: DC
  Filled 2011-10-19 (×2): qty 1

## 2011-10-19 MED ORDER — LANOLIN HYDROUS EX OINT: TOPICAL_OINTMENT | CUTANEOUS | Status: DC | PRN

## 2011-10-19 MED ORDER — OXYCODONE-ACETAMINOPHEN 5-325 MG PO TABS
1.0000 | ORAL_TABLET | ORAL | Status: DC | PRN
Start: 2011-10-19 — End: 2011-10-21

## 2011-10-19 MED ORDER — ZOLPIDEM TARTRATE 5 MG PO TABS: 5.0000 mg | ORAL_TABLET | Freq: Every evening | ORAL | Status: DC | PRN

## 2011-10-19 MED ORDER — BENZOCAINE-MENTHOL 20-0.5 % EX AERO
1.0000 "application " | INHALATION_SPRAY | CUTANEOUS | Status: DC | PRN
  Administered 2011-10-20: 1 via TOPICAL

## 2011-10-19 MED ORDER — SIMETHICONE 80 MG PO CHEW: 80.0000 mg | CHEWABLE_TABLET | ORAL | Status: DC | PRN

## 2011-10-19 MED ORDER — DIPHENHYDRAMINE HCL 50 MG/ML IJ SOLN: 12.5000 mg | INTRAMUSCULAR | Status: DC | PRN

## 2011-10-19 MED ORDER — DIPHENHYDRAMINE HCL 25 MG PO CAPS: 25.0000 mg | ORAL_CAPSULE | Freq: Four times a day (QID) | ORAL | Status: DC | PRN

## 2011-10-19 MED ORDER — PHENYLEPHRINE 40 MCG/ML (10ML) SYRINGE FOR IV PUSH (FOR BLOOD PRESSURE SUPPORT): 80.0000 ug | PREFILLED_SYRINGE | INTRAVENOUS | Status: DC | PRN

## 2011-10-19 MED ORDER — SENNOSIDES-DOCUSATE SODIUM 8.6-50 MG PO TABS
2.0000 | ORAL_TABLET | Freq: Every day | ORAL | Status: DC
  Administered 2011-10-19 – 2011-10-20 (×2): 2 via ORAL

## 2011-10-19 NOTE — Progress Notes (Signed)
Informed of the admin of ephedrine, FHR tracing and current BPs.

## 2011-10-19 NOTE — Progress Notes (Signed)
Updated Dr Billy Coast on pt status, FHr tracing, SVE, and UCs,

## 2011-10-19 NOTE — Progress Notes (Signed)
DINORAH MASULLO is a 30 y.o. G2P1001 at [redacted]w[redacted]d by LMP admitted for induction of labor due to Hydramnios.  Subjective:   Objective: BP 100/72  Pulse 77  Temp(Src) 97.5 F (36.4 C) (Oral)  Resp 20  Ht 5\' 3"  (1.6 m)  Wt 82.555 kg (182 lb)  BMI 32.24 kg/m2  SpO2 99%  LMP 01/18/2011      FHT:  FHR: 155 bpm, variability: moderate,  accelerations:  Present,  decelerations:  Absent UC:   irregular, every 5 minutes SVE:   Dilation: 2 Effacement (%): 60 Station: -3 Exam by:: H.Norton, RN  AROM- clear  Labs: Lab Results  Component Value Date   WBC 9.9 10/18/2011   HGB 11.9* 10/18/2011   HCT 36.1 10/18/2011   MCV 88.3 10/18/2011   PLT 262 10/18/2011    Assessment / Plan: Induction of labor due to polyhydramnios,  progressing well on pitocin  Labor: Progressing on Pitocin, will continue to increase then AROM Preeclampsia:  na Fetal Wellbeing:  Category I Pain Control:  Labor support without medications I/D:  n/a Anticipated MOD:  NSVD  Zakariye Nee J 10/19/2011, 8:42 AM

## 2011-10-19 NOTE — Progress Notes (Signed)
Holly Medina is a 30 y.o. G2P1001 at [redacted]w[redacted]d by LMP admitted for induction of labor due to Hydramnios.  Subjective:   Objective: BP 121/57  Pulse 85  Temp(Src) 98.3 F (36.8 C) (Oral)  Resp 20  Ht 5\' 3"  (1.6 m)  Wt 82.555 kg (182 lb)  BMI 32.24 kg/m2  SpO2 99%  LMP 01/18/2011      FHT:  FHR: 155 bpm, variability: moderate,  accelerations:  Present,  decelerations:  Absent UC:   regular, every 3 minutes SVE:   4-5/80/-1 IUPC placed without difficulty  Labs: Lab Results  Component Value Date   WBC 9.9 10/18/2011   HGB 11.9* 10/18/2011   HCT 36.1 10/18/2011   MCV 88.3 10/18/2011   PLT 262 10/18/2011    Assessment / Plan: Induction of labor due to polyhydramnios,  progressing well on pitocin  Labor: Progressing normally Preeclampsia:  na Fetal Wellbeing:  Category I Pain Control:  Epidural I/D:  n/a Anticipated MOD:  NSVD  Holly Medina 10/19/2011, 1:50 PM

## 2011-10-19 NOTE — Anesthesia Preprocedure Evaluation (Signed)
Anesthesia Evaluation  Patient identified by MRN, date of birth, ID band Patient awake    Reviewed: Allergy & Precautions, H&P , NPO status , Patient's Chart, lab work & pertinent test results, reviewed documented beta blocker date and time   History of Anesthesia Complications Negative for: history of anesthetic complications  Airway Mallampati: II TM Distance: >3 FB Neck ROM: full    Dental  (+) Teeth Intact   Pulmonary  clear to auscultation        Cardiovascular neg cardio ROS regular Normal    Neuro/Psych PSYCHIATRIC DISORDERS (depression, anxiety) Negative Neurological ROS     GI/Hepatic negative GI ROS, Neg liver ROS,   Endo/Other  Negative Endocrine ROS  Renal/GU negative Renal ROS  Genitourinary negative   Musculoskeletal   Abdominal   Peds  Hematology negative hematology ROS (+)   Anesthesia Other Findings   Reproductive/Obstetrics (+) Pregnancy                           Anesthesia Physical Anesthesia Plan  ASA: II  Anesthesia Plan: Epidural   Post-op Pain Management:    Induction:   Airway Management Planned:   Additional Equipment:   Intra-op Plan:   Post-operative Plan:   Informed Consent: I have reviewed the patients History and Physical, chart, labs and discussed the procedure including the risks, benefits and alternatives for the proposed anesthesia with the patient or authorized representative who has indicated his/her understanding and acceptance.     Plan Discussed with:   Anesthesia Plan Comments:         Anesthesia Quick Evaluation

## 2011-10-19 NOTE — Anesthesia Procedure Notes (Signed)
Epidural Patient location during procedure: OB Start time: 10/19/2011 9:21 AM Reason for block: procedure for pain  Staffing Performed by: anesthesiologist   Preanesthetic Checklist Completed: patient identified, site marked, surgical consent, pre-op evaluation, timeout performed, IV checked, risks and benefits discussed and monitors and equipment checked  Epidural Patient position: sitting Prep: site prepped and draped and DuraPrep Patient monitoring: continuous pulse ox and blood pressure Approach: midline Injection technique: LOR air  Needle:  Needle type: Tuohy  Needle gauge: 17 G Needle length: 9 cm Needle insertion depth: 5 cm cm Catheter type: closed end flexible Catheter size: 19 Gauge Catheter at skin depth: 10 cm Test dose: negative  Assessment Events: blood not aspirated, injection not painful, no injection resistance, negative IV test and no paresthesia  Additional Notes Discussed risk of headache, infection, bleeding, nerve injury and failed or incomplete block.  Patient voices understanding and wishes to proceed.

## 2011-10-19 NOTE — Progress Notes (Signed)
Report given to Carlota Raspberry

## 2011-10-19 NOTE — Progress Notes (Signed)
Delivery of live viable female by Dr Billy Coast, NICU called at 1 min of delivery. APGARs 4,8, 10 Baby to c. nursery

## 2011-10-19 NOTE — Progress Notes (Signed)
Pt complains of nausea and dizziness.  

## 2011-10-20 ENCOUNTER — Encounter (HOSPITAL_COMMUNITY): Payer: Self-pay

## 2011-10-20 LAB — CBC
Hemoglobin: 11.2 g/dL — ABNORMAL LOW (ref 12.0–15.0)
MCHC: 33 g/dL (ref 30.0–36.0)
Platelets: 234 10*3/uL (ref 150–400)
RBC: 3.85 MIL/uL — ABNORMAL LOW (ref 3.87–5.11)

## 2011-10-20 MED ORDER — BENZOCAINE-MENTHOL 20-0.5 % EX AERO
INHALATION_SPRAY | CUTANEOUS | Status: AC
  Administered 2011-10-20: 1 via TOPICAL
  Filled 2011-10-20: qty 56

## 2011-10-20 NOTE — Progress Notes (Signed)
Patient ID: Holly Medina, female   DOB: 1981/08/22, 30 y.o.   MRN: [id number] Circumcision note: Parents counselled. Consent signed. Risks vs benefits of procedure discussed. Decreased risks of UTI, STDs and penile cancer noted. Time out done. Ring block with 1 ml 1% xylocaine without complications. Procedure with Gomco 1.3 without complications. EBL: minimal  Pt tolerated procedure well.

## 2011-10-20 NOTE — Progress Notes (Signed)
PPD 1 SVD  S:  Reports feeling well.             Tolerating po/ No nausea or vomiting             Bleeding is moderate             Pain controlled withprescription NSAID's including motrin.             Up ad lib / ambulatory  Newborn  Information for the patient's newborn:  Dhanvi, Boesen [[id number]]  female  breast feeding  / Circumcision in progress.   O:  A & O x 3 NAD             VS: Blood pressure 106/72, pulse 80, temperature 98.2 F (36.8 C), temperature source Oral, resp. rate 20, height 5\' 3"  (1.6 m), weight 82.555 kg (182 lb), last menstrual period 01/18/2011, SpO2 96.00%, unknown if currently breastfeeding.  LABS:  Basename 10/20/11 0540 10/18/11 1840  HGB 11.2* 11.9*  HCT 33.9* 36.1    I&O: I/O last 3 completed shifts: In: -  Out: 300 [Blood:300]      Lungs: Clear and unlabored  Heart: regular rate and rhythm / no mumurs  Abdomen: soft, non-tender, non-distended              Fundus: firm, non-tender, U@  Perineum: intact, no edema  Lochia: small  Extremities:  Trace pedal edema, no calf pain or tenderness, neg  Homans    A/P: PPD # 1 30 y.o., [id number] S/P:induced vaginal   Principal Problem:  *Postpartum care following vaginal delivery (11/20)   Doing well - stable status  Routine post partum orders  Anticipate discharge home in AM.   PAUL,DANIELA, CNM, MSN 10/20/2011, 9:42 AM

## 2011-10-20 NOTE — Progress Notes (Signed)

## 2011-10-20 NOTE — Anesthesia Postprocedure Evaluation (Signed)
  Anesthesia Post-op Note  Patient: Holly Medina  Procedure(s) Performed: * No procedures listed *  Patient Location: 140  Anesthesia Type: Epidural  Level of Consciousness: awake, alert  and oriented  Airway and Oxygen Therapy: Patient Spontanous Breathing  Post-op Pain: mild  Post-op Assessment: Post-op Vital signs reviewed, Patient's Cardiovascular Status Stable, No headache, No backache, No residual numbness and No residual motor weakness  Post-op Vital Signs: Reviewed and stable  Complications: No apparent anesthesia complications

## 2011-10-21 NOTE — Discharge Summary (Signed)
Obstetric Discharge Summary Reason for Admission: onset of labor Prenatal Procedures: none Intrapartum Procedures: spontaneous vaginal delivery Postpartum Procedures: none Complications-Operative and Postpartum: none Hemoglobin  Date Value Range Status  10/20/2011 11.2* 12.0-15.0 (g/dL) Final     HCT  Date Value Range Status  10/20/2011 33.9* 36.0-46.0 (%) Final    Discharge Diagnoses: Term Pregnancy-delivered  Discharge Information: Date: 10/21/2011 Activity: pelvic rest Diet: routine Medications: PNV and Ibuprofen and zoloft (100 mg) Condition: stable Instructions: refer to practice specific booklet Discharge to: home Follow-up Information    Follow up with Lenoard Aden, MD. Make an appointment in 6 weeks.   Contact information:   [address] [place] [address] [phone]          Newborn Data: Live born female  Birth Weight: 9 lb 1.9 oz (4136 g) APGAR: 4, 8  Home with mother.  Holly Medina 10/21/2011, 10:44 AM

## 2011-10-21 NOTE — Progress Notes (Signed)
Patient ID: Holly Medina, female   DOB: 1981-03-17, 30 y.o.   MRN: [id number]  PPD 2 SVD  S:  Reports feeling well             Tolerating po/ No nausea or vomiting             Bleeding is light             Pain controlled withprescription NSAID's including motrin             Up ad lib / ambulatory  Newborn breast feeding  / Circumcision done   O:  A & O x 3 NAD             VS: Blood pressure 100/65, pulse 76, temperature 97.8 F (36.6 C), temperature source Oral, resp. rate 18, height 5\' 3"  (1.6 m), weight 82.555 kg (182 lb), last menstrual period 01/18/2011, SpO2 97.00%, unknown if currently breastfeeding.  LABS: Lab Results  Component Value Date   WBC 13.7* 10/20/2011   HGB 11.2* 10/20/2011   HCT 33.9* 10/20/2011   MCV 88.1 10/20/2011   PLT 234 10/20/2011   Lungs: Clear and unlabored  Heart: regular rate and rhythm / no mumurs  Abdomen: soft, non-tender, non-distended              Fundus: firm, non-tender, U-1  Perineum: no edema  Lochia: light  Extremities: trace edema, no calf pain or tenderness    A: PPD # 2   Doing well - stable status  P:  Routine post partum orders  Discharge to home  [facility] 10/21/2011, 10:42 AM

## 2011-10-25 ENCOUNTER — Ambulatory Visit (HOSPITAL_COMMUNITY)
Admission: RE | Admit: 2011-10-25 | Discharge: 2011-10-25 | Disposition: A | Discharge status: Home / Self Care | Payer: BC Managed Care – PPO | Source: Ambulatory Visit | Attending: Obstetrics and Gynecology | Admitting: Obstetrics and Gynecology

## 2011-10-25 NOTE — Progress Notes (Signed)
Adult Lactation Consultation Outpatient Visit Note  Patient Name: Holly Medina Date of Birth: 1981/04/27 Gestational Age at Delivery: Unknown Type of Delivery:Per mom -  10/19/2011 Vaginal ,[place] boy Holly Medina  Birth wt = 9-2oz, D/c wt = 8-6oz, 11/24 Sat. Wt at Dr. Earmon [place] office = 8-9oz . Milk came in Holly Medina.  11/22 . Chief complaint today is "SORE NIPPLES , BOTH SIDES " , per mom the soreness started in the hospital and hasn't improved .   Breastfeeding History: Frequency of Breastfeeding: every 2-3 hours Length of Feeding: 10-20 mins or greater  Stools: 7 - yellow  , Voids - 7   Supplementing / Method: no supplementing , plenty of milk  Pumping:  Type of Pump: DEBP - Medela and a hand pump , per mom using PRN    Frequency:  Volume:    Comments:    Consultation Evaluation:  Initial Feeding Assessment: Pre-feed Weight:8-14oz ( 4026g)  Post-feed Weight:8-15.7oz ( 1610R)  Amount Transferred:30ml  Comments: Mom has plenty of milk ,breast full but not engorged. Prior to latching reviewed for latching ( breast  massage hand express , Reverse pressure exercise ,firm support , working to get "BABY Holly Medina " to open wide prior  to latch ( for a good size infant has a small mouth ) . As the [facility] assist with obtaining a deeper latch had mom apply pressure on breast tissue above infant mouth ,infant in a consistent pattern with swallows and gulps ( per mom much more comfortable ) . Holly Medina fed for .20 mins . With a 48ml yield indicated in a post weight . Infant very relaxed and sleeping after feeding .   Additional Feeding Assessment: Voided prior to 2nd breast  Pre-feed Weight: 8.15oz. (4060g)  Post-feed Weight: 8.15.4oz ( 4066g)  Amount Transferred:6 ml  Comments: Infant latched 2nd breast for 4-5 mins and ended feeding . Resting in moms arms content . Mom worked on obtaining depth 2nd breast and she stated I felt alittle pinching at first . Mom has so much milk and LC stressed the  importance of massage , hand express , pre pump with hand pump  To decrease fullness .   Additional Feeding Assessment: Pre-feed Weight: Post-feed Weight: Amount Transferred: Comments:  Total Breast milk Transferred this Visit:  Total Supplement Given:   Additional Interventions: Written plan of care given , stressing BASICS    Follow-Up PRN if soreness doesn't improve 4 days . Comfort gels with instructions .       Kathrin Greathouse 10/25/2011, 5:14 PM

## 2013-08-21 LAB — OB RESULTS CONSOLE GC/CHLAMYDIA
CHLAMYDIA, DNA PROBE: NEGATIVE
Gonorrhea: NEGATIVE

## 2013-08-21 LAB — OB RESULTS CONSOLE ANTIBODY SCREEN: ANTIBODY SCREEN: NEGATIVE

## 2013-08-21 LAB — OB RESULTS CONSOLE ABO/RH: RH TYPE: POSITIVE

## 2013-08-21 LAB — OB RESULTS CONSOLE RUBELLA ANTIBODY, IGM: RUBELLA: IMMUNE

## 2013-08-21 LAB — OB RESULTS CONSOLE HEPATITIS B SURFACE ANTIGEN: HEP B S AG: NEGATIVE

## 2013-08-21 LAB — OB RESULTS CONSOLE RPR: RPR: NONREACTIVE

## 2013-08-21 LAB — OB RESULTS CONSOLE HIV ANTIBODY (ROUTINE TESTING): HIV: NONREACTIVE

## 2013-09-05 ENCOUNTER — Inpatient Hospital Stay (HOSPITAL_COMMUNITY): Admission: AD | Admit: 2013-09-05 | Payer: Self-pay | Source: Ambulatory Visit | Admitting: Obstetrics and Gynecology

## 2013-11-29 NOTE — L&D Delivery Note (Signed)
Delivery Note At 11:50 PM a viable and healthy female was delivered via Vaginal, Spontaneous Delivery (Presentation: Left Occiput Anterior).  APGAR: 8, 9; weight pending.   Placenta status: Intact, Spontaneous.  Cord: 3 vessels with the following complications:none.  Cord pH: na  Anesthesia: Epidural  Episiotomy: None Lacerations: None Suture Repair: na Est. Blood Loss (mL): 100  Mom to postpartum.  Baby to Couplet care / Skin to Skin.  Bryli Mantey J Maria Gallicchio 03/24/2014, 11:59 PM

## 2014-02-25 LAB — OB RESULTS CONSOLE GBS: GBS: POSITIVE

## 2014-03-12 ENCOUNTER — Encounter (HOSPITAL_COMMUNITY): Payer: Self-pay | Admitting: *Deleted

## 2014-03-12 ENCOUNTER — Telehealth (HOSPITAL_COMMUNITY): Payer: Self-pay | Admitting: *Deleted

## 2014-03-12 NOTE — Telephone Encounter (Signed)
Preadmission screen  

## 2014-03-13 ENCOUNTER — Other Ambulatory Visit: Payer: Self-pay | Admitting: Obstetrics and Gynecology

## 2014-03-24 ENCOUNTER — Encounter (HOSPITAL_COMMUNITY): Payer: Self-pay

## 2014-03-24 ENCOUNTER — Encounter (HOSPITAL_COMMUNITY): Payer: 59 | Admitting: Anesthesiology

## 2014-03-24 ENCOUNTER — Inpatient Hospital Stay (HOSPITAL_COMMUNITY)
Admission: RE | Admit: 2014-03-24 | Discharge: 2014-03-26 | DRG: 775 | Disposition: A | Discharge status: Home / Self Care | Payer: 59 | Source: Ambulatory Visit | Attending: Obstetrics and Gynecology | Admitting: Obstetrics and Gynecology

## 2014-03-24 ENCOUNTER — Inpatient Hospital Stay (HOSPITAL_COMMUNITY): Payer: 59 | Admitting: Anesthesiology

## 2014-03-24 DIAGNOSIS — O409XX0 Polyhydramnios, unspecified trimester, not applicable or unspecified: Secondary | ICD-10-CM | POA: Diagnosis present

## 2014-03-24 DIAGNOSIS — O99344 Other mental disorders complicating childbirth: Secondary | ICD-10-CM | POA: Diagnosis present

## 2014-03-24 DIAGNOSIS — O99892 Other specified diseases and conditions complicating childbirth: Secondary | ICD-10-CM | POA: Diagnosis present

## 2014-03-24 DIAGNOSIS — D509 Iron deficiency anemia, unspecified: Secondary | ICD-10-CM | POA: Diagnosis present

## 2014-03-24 DIAGNOSIS — Z87891 Personal history of nicotine dependence: Secondary | ICD-10-CM

## 2014-03-24 DIAGNOSIS — Z8249 Family history of ischemic heart disease and other diseases of the circulatory system: Secondary | ICD-10-CM

## 2014-03-24 DIAGNOSIS — F341 Dysthymic disorder: Secondary | ICD-10-CM | POA: Diagnosis present

## 2014-03-24 DIAGNOSIS — Z22330 Carrier of Group B streptococcus: Secondary | ICD-10-CM

## 2014-03-24 DIAGNOSIS — O9989 Other specified diseases and conditions complicating pregnancy, childbirth and the puerperium: Secondary | ICD-10-CM

## 2014-03-24 DIAGNOSIS — O9902 Anemia complicating childbirth: Secondary | ICD-10-CM | POA: Diagnosis present

## 2014-03-24 DIAGNOSIS — O3660X0 Maternal care for excessive fetal growth, unspecified trimester, not applicable or unspecified: Principal | ICD-10-CM | POA: Diagnosis present

## 2014-03-24 LAB — CBC
HEMATOCRIT: 33 % — AB (ref 36.0–46.0)
Hemoglobin: 10.8 g/dL — ABNORMAL LOW (ref 12.0–15.0)
MCH: 27.6 pg (ref 26.0–34.0)
MCHC: 32.7 g/dL (ref 30.0–36.0)
MCV: 84.4 fL (ref 78.0–100.0)
PLATELETS: 241 10*3/uL (ref 150–400)
RBC: 3.91 MIL/uL (ref 3.87–5.11)
RDW: 13.8 % (ref 11.5–15.5)
WBC: 7.6 10*3/uL (ref 4.0–10.5)

## 2014-03-24 LAB — RPR

## 2014-03-24 LAB — TYPE AND SCREEN
ABO/RH(D): B POS
Antibody Screen: NEGATIVE

## 2014-03-24 LAB — ABO/RH: ABO/RH(D): B POS

## 2014-03-24 MED ORDER — PENICILLIN G POTASSIUM 5000000 UNITS IJ SOLR
5.0000 10*6.[IU] | Freq: Once | INTRAVENOUS | Status: AC
  Administered 2014-03-24: 5 10*6.[IU] via INTRAVENOUS
  Filled 2014-03-24: qty 5

## 2014-03-24 MED ORDER — TERBUTALINE SULFATE 1 MG/ML IJ SOLN: 0.2500 mg | Freq: Once | INTRAMUSCULAR | Status: AC | PRN

## 2014-03-24 MED ORDER — IBUPROFEN 600 MG PO TABS: 600.0000 mg | ORAL_TABLET | Freq: Four times a day (QID) | ORAL | Status: DC | PRN

## 2014-03-24 MED ORDER — OXYTOCIN 40 UNITS IN LACTATED RINGERS INFUSION - SIMPLE MED
62.5000 mL/h | INTRAVENOUS | Status: DC
  Filled 2014-03-24: qty 1000

## 2014-03-24 MED ORDER — PHENYLEPHRINE 40 MCG/ML (10ML) SYRINGE FOR IV PUSH (FOR BLOOD PRESSURE SUPPORT)
80.0000 ug | PREFILLED_SYRINGE | INTRAVENOUS | Status: DC | PRN
  Filled 2014-03-24: qty 2

## 2014-03-24 MED ORDER — LIDOCAINE HCL (PF) 1 % IJ SOLN
INTRAMUSCULAR | Status: DC | PRN
  Administered 2014-03-24 (×2): 4 mL

## 2014-03-24 MED ORDER — PHENYLEPHRINE 40 MCG/ML (10ML) SYRINGE FOR IV PUSH (FOR BLOOD PRESSURE SUPPORT)
PREFILLED_SYRINGE | INTRAVENOUS | Status: AC
  Filled 2014-03-24: qty 10

## 2014-03-24 MED ORDER — OXYTOCIN BOLUS FROM INFUSION
500.0000 mL | INTRAVENOUS | Status: DC
  Administered 2014-03-24: 500 mL via INTRAVENOUS

## 2014-03-24 MED ORDER — FLEET ENEMA 7-19 GM/118ML RE ENEM: 1.0000 | ENEMA | Freq: Once | RECTAL | Status: DC

## 2014-03-24 MED ORDER — DIPHENHYDRAMINE HCL 50 MG/ML IJ SOLN: 12.5000 mg | INTRAMUSCULAR | Status: DC | PRN

## 2014-03-24 MED ORDER — CITRIC ACID-SODIUM CITRATE 334-500 MG/5ML PO SOLN: 30.0000 mL | ORAL | Status: DC | PRN

## 2014-03-24 MED ORDER — EPHEDRINE 5 MG/ML INJ
10.0000 mg | INTRAVENOUS | Status: DC | PRN
  Filled 2014-03-24: qty 2

## 2014-03-24 MED ORDER — PENICILLIN G POTASSIUM 5000000 UNITS IJ SOLR
2.5000 10*6.[IU] | INTRAVENOUS | Status: DC
  Administered 2014-03-24 (×2): 2.5 10*6.[IU] via INTRAVENOUS
  Filled 2014-03-24 (×6): qty 2.5

## 2014-03-24 MED ORDER — OXYCODONE-ACETAMINOPHEN 5-325 MG PO TABS: 1.0000 | ORAL_TABLET | ORAL | Status: DC | PRN

## 2014-03-24 MED ORDER — LACTATED RINGERS IV SOLN: 500.0000 mL | Freq: Once | INTRAVENOUS | Status: DC

## 2014-03-24 MED ORDER — LIDOCAINE HCL (PF) 1 % IJ SOLN
30.0000 mL | INTRAMUSCULAR | Status: DC | PRN
  Filled 2014-03-24: qty 30

## 2014-03-24 MED ORDER — ONDANSETRON HCL 4 MG/2ML IJ SOLN: 4.0000 mg | Freq: Four times a day (QID) | INTRAMUSCULAR | Status: DC | PRN

## 2014-03-24 MED ORDER — ACETAMINOPHEN 325 MG PO TABS: 650.0000 mg | ORAL_TABLET | ORAL | Status: DC | PRN

## 2014-03-24 MED ORDER — FENTANYL 2.5 MCG/ML BUPIVACAINE 1/10 % EPIDURAL INFUSION (WH - ANES): 14.0000 mL/h | INTRAMUSCULAR | Status: DC | PRN

## 2014-03-24 MED ORDER — EPHEDRINE 5 MG/ML INJ
INTRAVENOUS | Status: AC
  Filled 2014-03-24: qty 4

## 2014-03-24 MED ORDER — FENTANYL 2.5 MCG/ML BUPIVACAINE 1/10 % EPIDURAL INFUSION (WH - ANES)
INTRAMUSCULAR | Status: AC
  Filled 2014-03-24: qty 125

## 2014-03-24 MED ORDER — LACTATED RINGERS IV SOLN: 500.0000 mL | INTRAVENOUS | Status: DC | PRN

## 2014-03-24 MED ORDER — OXYTOCIN 40 UNITS IN LACTATED RINGERS INFUSION - SIMPLE MED
1.0000 m[IU]/min | INTRAVENOUS | Status: DC
  Administered 2014-03-24: 2 m[IU]/min via INTRAVENOUS

## 2014-03-24 MED ORDER — FENTANYL 2.5 MCG/ML BUPIVACAINE 1/10 % EPIDURAL INFUSION (WH - ANES)
INTRAMUSCULAR | Status: DC | PRN
  Administered 2014-03-24: 14 mL/h via EPIDURAL

## 2014-03-24 MED ORDER — LACTATED RINGERS IV SOLN
INTRAVENOUS | Status: DC
  Administered 2014-03-24 (×2): via INTRAVENOUS

## 2014-03-24 NOTE — Anesthesia Procedure Notes (Signed)
Epidural Patient location during procedure: OB Start time: 03/24/2014 6:45 PM End time: 03/24/2014 6:55 PM  Staffing Anesthesiologist: Lewie LoronGERMEROTH, Tianne Plott R Performed by: anesthesiologist   Preanesthetic Checklist Completed: patient identified, pre-op evaluation, timeout performed, IV checked, risks and benefits discussed and monitors and equipment checked  Epidural Patient position: sitting Prep: site prepped and draped and DuraPrep Patient monitoring: heart rate Approach: midline Location: L2-L3 Injection technique: LOR air and LOR saline  Needle:  Needle type: Tuohy  Needle gauge: 17 G Needle length: 9 cm Needle insertion depth: 6 cm Catheter type: closed end flexible Catheter size: 19 Gauge Catheter at skin depth: 12 cm Test dose: negative  Assessment Sensory level: T8 Events: blood not aspirated, injection not painful, no injection resistance, negative IV test and no paresthesia  Additional Notes Reason for block:procedure for pain

## 2014-03-24 NOTE — Anesthesia Preprocedure Evaluation (Signed)
Anesthesia Evaluation  Patient identified by MRN, date of birth, ID band Patient awake    Reviewed: Allergy & Precautions, H&P , NPO status , Patient's Chart, lab work & pertinent test results, reviewed documented beta blocker date and time   History of Anesthesia Complications Negative for: history of anesthetic complications  Airway Mallampati: II TM Distance: >3 FB Neck ROM: full    Dental  (+) Teeth Intact   Pulmonary former smoker,  breath sounds clear to auscultation        Cardiovascular negative cardio ROS  Rhythm:regular Rate:Normal     Neuro/Psych PSYCHIATRIC DISORDERS (depression, anxiety) negative neurological ROS     GI/Hepatic negative GI ROS, Neg liver ROS,   Endo/Other  negative endocrine ROS  Renal/GU negative Renal ROS  negative genitourinary   Musculoskeletal   Abdominal   Peds  Hematology negative hematology ROS (+)   Anesthesia Other Findings   Reproductive/Obstetrics (+) Pregnancy                           Anesthesia Physical  Anesthesia Plan  ASA: II  Anesthesia Plan: Epidural   Post-op Pain Management:    Induction:   Airway Management Planned:   Additional Equipment:   Intra-op Plan:   Post-operative Plan:   Informed Consent: I have reviewed the patients History and Physical, chart, labs and discussed the procedure including the risks, benefits and alternatives for the proposed anesthesia with the patient or authorized representative who has indicated his/her understanding and acceptance.     Plan Discussed with:   Anesthesia Plan Comments:         Anesthesia Quick Evaluation

## 2014-03-24 NOTE — H&P (Signed)
Holly Medina is a 33 y.o. female presenting for induction due to LGA with poly. Maternal Medical History:  Contractions: Frequency: rare.   Perceived severity is mild.    Fetal activity: Perceived fetal activity is normal.   Last perceived fetal movement was within the past hour.    Prenatal complications: Polyhydramnios.   Prenatal Complications - Diabetes: none.    OB History   Grav Para Term Preterm Abortions TAB SAB Ect Mult Living   3 2 2  0 0 0 0 0 0 2     Past Medical History  Diagnosis Date  . Anxiety   . Abnormal Pap smear   . Asthma   . Postpartum care following vaginal delivery (11/20) 10/20/2011  . Vaginal Pap smear, abnormal    No past surgical history on file. Family History: family history includes Asthma in her mother; Cancer in her maternal grandfather; Hypertension in her father; Multiple sclerosis in her maternal grandmother. Social History:  reports that she quit smoking about 11 years ago. Her smoking use included Cigarettes. She smoked 0.00 packs per day. She does not have any smokeless tobacco history on file. She reports that she drinks about .5 ounces of alcohol per week. She reports that she does not use illicit drugs.   Prenatal Transfer Tool  Maternal Diabetes: No Genetic Screening: Normal Maternal Ultrasounds/Referrals: Normal Fetal Ultrasounds or other Referrals:  None Maternal Substance Abuse:  No Significant Maternal Medications:  None Significant Maternal Lab Results:  Lab values include: Group B Strep positive Other Comments:  None  Review of Systems  All other systems reviewed and are negative.     unknown if currently breastfeeding. Maternal Exam:  Uterine Assessment: Contraction strength is mild.  Contraction frequency is rare.   Abdomen: Patient reports no abdominal tenderness. Fetal presentation: vertex  Introitus: Normal vulva. Normal vagina.  Ferning test: not done.  Nitrazine test: not done. Amniotic fluid  character: not assessed.  Pelvis: adequate for delivery.   Cervix: Cervix evaluated by digital exam.     Physical Exam  Nursing note and vitals reviewed. Constitutional: She is oriented to person, place, and time. She appears well-developed and well-nourished.  HENT:  Head: Normocephalic.  Neck: Normal range of motion. Neck supple.  Cardiovascular: Normal rate, regular rhythm and normal heart sounds.   Respiratory: Effort normal and breath sounds normal.  GI: Soft. Bowel sounds are normal.  Genitourinary: Vagina normal and uterus normal.  Musculoskeletal: Normal range of motion.  Neurological: She is alert and oriented to person, place, and time. She has normal reflexes.  Skin: Skin is warm and dry.  Psychiatric: She has a normal mood and affect.    Prenatal labs: ABO, Rh: B/Positive/-- (09/23 0000) Antibody: Negative (09/23 0000) Rubella: Immune (09/23 0000) RPR: Nonreactive (09/23 0000)  HBsAg: Negative (09/23 0000)  HIV: Non-reactive (09/23 0000)  GBS: Positive (03/30 0000)   Assessment/Plan: LGA with poly for induction History of LGA GBS positive Proceed with Pitocin   Holly Medina 03/24/2014, 2:08 PM

## 2014-03-24 NOTE — Progress Notes (Signed)
Holly Medina is a 33 y.o. G3P2002 at 5422w0d by LMP admitted for induction of labor due to Hydramnios.  Subjective: Getting uncomfortable  Objective: BP 105/66  Pulse 76  Temp(Src) 98.5 F (36.9 C) (Oral)  Resp 19  Ht 5\' 3"  (1.6 m)  Wt 80.74 kg (178 lb)  BMI 31.54 kg/m2      FHT:  FHR: 145 bpm, variability: moderate,  accelerations:  Present,  decelerations:  Absent UC:   regular, every 2-3 minutes SVE:   Dilation: 3 Effacement (%): 70 Station: -2;-3 Exam by:: dr Billy Coasttaavon AROM- clear  Labs: Lab Results  Component Value Date   WBC 7.6 03/24/2014   HGB 10.8* 03/24/2014   HCT 33.0* 03/24/2014   MCV 84.4 03/24/2014   PLT 241 03/24/2014    Assessment / Plan: Induction of labor due to LGA with poly,  progressing well on pitocin  Labor: Progressing normally Preeclampsia:  no signs or symptoms of toxicity Fetal Wellbeing:  Category I Pain Control:  Labor support without medications I/D:  n/a Anticipated MOD:  NSVD  Holly Medina 03/24/2014, 6:28 PM

## 2014-03-25 ENCOUNTER — Encounter (HOSPITAL_COMMUNITY): Payer: Self-pay

## 2014-03-25 LAB — CBC
HEMATOCRIT: 32.8 % — AB (ref 36.0–46.0)
Hemoglobin: 10.6 g/dL — ABNORMAL LOW (ref 12.0–15.0)
MCH: 27.1 pg (ref 26.0–34.0)
MCHC: 32.3 g/dL (ref 30.0–36.0)
MCV: 83.9 fL (ref 78.0–100.0)
Platelets: 217 10*3/uL (ref 150–400)
RBC: 3.91 MIL/uL (ref 3.87–5.11)
RDW: 13.9 % (ref 11.5–15.5)
WBC: 12.7 10*3/uL — ABNORMAL HIGH (ref 4.0–10.5)

## 2014-03-25 MED ORDER — ZOLPIDEM TARTRATE 5 MG PO TABS: 5.0000 mg | ORAL_TABLET | Freq: Every evening | ORAL | Status: DC | PRN

## 2014-03-25 MED ORDER — LANOLIN HYDROUS EX OINT: TOPICAL_OINTMENT | CUTANEOUS | Status: DC | PRN

## 2014-03-25 MED ORDER — METHYLERGONOVINE MALEATE 0.2 MG PO TABS: 0.2000 mg | ORAL_TABLET | ORAL | Status: DC | PRN

## 2014-03-25 MED ORDER — DIPHENHYDRAMINE HCL 25 MG PO CAPS: 25.0000 mg | ORAL_CAPSULE | Freq: Four times a day (QID) | ORAL | Status: DC | PRN

## 2014-03-25 MED ORDER — ONDANSETRON HCL 4 MG PO TABS: 4.0000 mg | ORAL_TABLET | ORAL | Status: DC | PRN

## 2014-03-25 MED ORDER — ONDANSETRON HCL 4 MG/2ML IJ SOLN: 4.0000 mg | INTRAMUSCULAR | Status: DC | PRN

## 2014-03-25 MED ORDER — SERTRALINE HCL 100 MG PO TABS
150.0000 mg | ORAL_TABLET | Freq: Every day | ORAL | Status: DC
  Filled 2014-03-25 (×2): qty 1

## 2014-03-25 MED ORDER — METHYLERGONOVINE MALEATE 0.2 MG/ML IJ SOLN: 0.2000 mg | INTRAMUSCULAR | Status: DC | PRN

## 2014-03-25 MED ORDER — WITCH HAZEL-GLYCERIN EX PADS: 1.0000 "application " | MEDICATED_PAD | CUTANEOUS | Status: DC | PRN

## 2014-03-25 MED ORDER — IBUPROFEN 600 MG PO TABS
600.0000 mg | ORAL_TABLET | Freq: Four times a day (QID) | ORAL | Status: DC
  Administered 2014-03-25 – 2014-03-26 (×6): 600 mg via ORAL
  Filled 2014-03-25 (×6): qty 1

## 2014-03-25 MED ORDER — DIBUCAINE 1 % RE OINT
1.0000 | TOPICAL_OINTMENT | RECTAL | Status: DC | PRN
Start: 2014-03-25 — End: 2014-03-26

## 2014-03-25 MED ORDER — PRENATAL MULTIVITAMIN CH
1.0000 | ORAL_TABLET | Freq: Every day | ORAL | Status: DC
  Administered 2014-03-25 – 2014-03-26 (×2): 1 via ORAL
  Filled 2014-03-25 (×2): qty 1

## 2014-03-25 MED ORDER — OXYCODONE-ACETAMINOPHEN 5-325 MG PO TABS: 1.0000 | ORAL_TABLET | ORAL | Status: DC | PRN

## 2014-03-25 MED ORDER — SIMETHICONE 80 MG PO CHEW: 80.0000 mg | CHEWABLE_TABLET | ORAL | Status: DC | PRN

## 2014-03-25 MED ORDER — POLYSACCHARIDE IRON COMPLEX 150 MG PO CAPS
150.0000 mg | ORAL_CAPSULE | Freq: Every day | ORAL | Status: DC
  Administered 2014-03-25 – 2014-03-26 (×2): 150 mg via ORAL
  Filled 2014-03-25 (×2): qty 1

## 2014-03-25 MED ORDER — BENZOCAINE-MENTHOL 20-0.5 % EX AERO: 1.0000 "application " | INHALATION_SPRAY | CUTANEOUS | Status: DC | PRN

## 2014-03-25 MED ORDER — TETANUS-DIPHTH-ACELL PERTUSSIS 5-2.5-18.5 LF-MCG/0.5 IM SUSP: 0.5000 mL | Freq: Once | INTRAMUSCULAR | Status: DC

## 2014-03-25 MED ORDER — SENNOSIDES-DOCUSATE SODIUM 8.6-50 MG PO TABS
2.0000 | ORAL_TABLET | ORAL | Status: DC
  Administered 2014-03-26: 2 via ORAL
  Filled 2014-03-25: qty 2

## 2014-03-25 NOTE — Lactation Note (Signed)
This note was copied from the chart of Holly Holly Medina. Lactation Consultation Note: initial visit with this experienced BF mom. Reports that baby latched well after delivery but has been sleepy since. Was just circ'd and is asleep in grandmother's arms. Reviewed normal behavior the first 24 hours and after circ. Encouraged to call for assist if needed when baby wakes. Reports that she had very sore nipples the first few Shakesha Soltau breastfeeding her other babies. Reviewed wide open mouth and keeping the baby close to the breast throughout the feeding. BF brochure given with resources for support after DC. No questions at present. To call prn  Patient Name: Holly Medina Today's Date: 03/25/2014 Reason for consult: Initial assessment   Maternal Data Formula Feeding for Exclusion: No Infant to breast within first hour of birth: Yes Does the patient have breastfeeding experience prior to this delivery?: Yes  Feeding Feeding Type: Breast Fed Length of feed: 0 min  LATCH Score/Interventions Latch: Too sleepy or reluctant, no latch achieved, no sucking elicited.                    Lactation Tools Discussed/Used     Consult Status Consult Status: PRN    Pamelia HoitDonna D Jakiyah Stepney 03/25/2014, 11:51 AM

## 2014-03-25 NOTE — Progress Notes (Signed)
Patient was referred for history of depression/anxiety. * Referral screened out by Clinical Social Worker because none of the following criteria appear to apply: ~ History of anxiety/depression during this pregnancy, or of post-partum depression. ~ Diagnosis of anxiety and/or depression within last 3 years ~ History of depression due to pregnancy loss/loss of child OR * Patient's symptoms currently being treated with medication and/or therapy. Please contact the Clinical Social Worker if needs arise, or if patient requests.  Patient has Rx for Zoloft.

## 2014-03-25 NOTE — Anesthesia Postprocedure Evaluation (Signed)
  Anesthesia Post-op Note  Anesthesia Post Note  Patient: Holly Medina  Procedure(s) Performed: * No procedures listed *  Anesthesia type: Epidural  Patient location: Mother/Baby  Post pain: Pain level controlled  Post assessment: Post-op Vital signs reviewed  Last Vitals:  Filed Vitals:   03/25/14 0715  BP: 93/51  Pulse: 67  Temp: 36.7 C  Resp: 16    Post vital signs: Reviewed  Level of consciousness:alert  Complications: No apparent anesthesia complications

## 2014-03-25 NOTE — Progress Notes (Signed)
PPD #1- SVD  Subjective:   Reports feeling well, sore Tolerating po/ No nausea or vomiting Bleeding is light Pain controlled with Motrin Up ad lib / ambulatory / voiding without problems Newborn: breastfeeding  / Circumcision: planning   Objective:   VS:  VS:  Filed Vitals:   03/25/14 0208 03/25/14 0230 03/25/14 0330 03/25/14 0715  BP: 95/56 102/65 95/59 93/51   Pulse: 73 68 70 67  Temp:  98 F (36.7 C) 97.9 F (36.6 C) 98.1 F (36.7 C)  TempSrc:  Oral Oral Oral  Resp:  18 16 16   Height:      Weight:      SpO2:  97% 95% 97%    LABS:  Recent Labs  03/24/14 1430 03/25/14 0535  WBC 7.6 12.7*  HGB 10.8* 10.6*  PLT 241 217   Blood type: --/--/B POS, B POS (04/26 1430) Rubella: Immune (09/23 0000)   I&O: Intake/Output     04/26 0701 - 04/27 0700 04/27 0701 - 04/28 0700   Blood 100    Total Output 100     Net -100            Physical Exam: Alert and oriented x3 Abdomen: soft, non-tender, non-distended  Fundus: firm, non-tender, U-1 Perineum: intact Lochia: small Extremities: no edema, no calf pain or tenderness    Assessment:  PPD # 1G3P3003/ S/P:induced vaginal  IDA  Doing well    Plan: Continue routine post partum orders Start Niferex daily Anticipate D/C home tomorrow   Lawernce PittsMelanie N Greydon Betke MSN, CNM 03/25/2014, 9:02 AM

## 2014-03-26 MED ORDER — POLYSACCHARIDE IRON COMPLEX 150 MG PO CAPS: 150.0000 mg | ORAL_CAPSULE | Freq: Every day | ORAL | Status: DC

## 2014-03-26 MED ORDER — IBUPROFEN 600 MG PO TABS: 600.0000 mg | ORAL_TABLET | Freq: Four times a day (QID) | ORAL | Status: DC

## 2014-03-26 MED ORDER — LANOLIN HYDROUS EX OINT: 1.0000 "application " | TOPICAL_OINTMENT | CUTANEOUS | Status: DC | PRN

## 2014-03-26 NOTE — Progress Notes (Signed)
PPD #2- SVD  Subjective:   Reports feeling well, sore Tolerating po/ No nausea or vomiting Bleeding is light Pain controlled with Motrin Up ad lib / ambulatory / voiding without problems Newborn: breastfeeding - Lt nipple developing soreness possibly d/t poor latch / Circumcision: done   Objective:   VS:  VS:  Filed Vitals:   03/25/14 0330 03/25/14 0715 03/25/14 1749 03/26/14 0530  BP: 95/59 93/51 98/60  106/69  Pulse: 70 67 68 68  Temp: 97.9 F (36.6 C) 98.1 F (36.7 C) 98.2 F (36.8 C) 97.4 F (36.3 C)  TempSrc: Oral Oral Oral Oral  Resp: 16 16 18 18   Height:      Weight:      SpO2: 95% 97%      LABS:   Recent Labs  03/24/14 1430 03/25/14 0535  WBC 7.6 12.7*  HGB 10.8* 10.6*  PLT 241 217   Blood type: --/--/B POS, B POS (04/26 1430) Rubella: Immune (09/23 0000)   I&O: Intake/Output     04/27 0701 - 04/28 0700 04/28 0701 - 04/29 0700   Blood     Total Output       Net              Physical Exam: Alert and oriented x3 Abdomen: soft, non-tender, non-distended  Fundus: firm, non-tender, U-1 Perineum: intact Lochia: small Extremities: no edema, no calf pain or tenderness    Assessment:  PPD # 2 / G3P3003/ S/P:induced vaginal  IDA  Doing well    Plan: Continue routine post partum orders Continue Niferex daily D/C home today   Kenard Gowerolitta M Brionna Romanek MSN, CNM 03/26/2014, 9:03 AM

## 2014-03-26 NOTE — Discharge Summary (Signed)
Obstetric Discharge Summary Reason for Admission: induction of labor for LGA and polyhydramnios Prenatal Procedures: ultrasound Intrapartum Procedures: spontaneous vaginal delivery Postpartum Procedures: none Complications-Operative and Postpartum: none Hemoglobin  Date Value Ref Range Status  03/25/2014 10.6* 12.0 - 15.0 g/dL Final     HCT  Date Value Ref Range Status  03/25/2014 32.8* 36.0 - 46.0 % Final    Physical Exam:  General: alert, cooperative and no distress Lochia: appropriate Uterine Fundus: firm, midline, U-2 Perineum: intact DVT Evaluation: No evidence of DVT seen on physical exam. Negative Homan's sign. No cords or calf tenderness. No significant calf/ankle edema.  Labor Course: Patient was admitted to L&D for induction due to LGA with polyhydramnios / (+) GBS treatment during labor / progressed with Pitocin / SVD of viable female by Dr. Billy Coastaavon / No lacerations identified after delivery / no immediate postpartum complications   Discharge Diagnoses: Term Pregnancy-delivered        Polyhydramnios - delivered        LGA - delivered        H/O anxiety Discharge Information: Date: 03/26/2014 Activity: pelvic rest x 6 weeks Diet: routine Medications: PNV, Ibuprofen, Iron and Zoloft Condition: stable Instructions: refer to practice specific booklet Discharge to: home Follow-up Information   Follow up with Lenoard AdenAAVON,RICHARD J, MD. Schedule an appointment as soon as possible for a visit in 6 weeks.   Specialty:  Obstetrics and Gynecology   Contact information:   [address] [place] [id number] [phone]       Newborn Data: Live born female on 03/24/2014 Birth Weight: 9 lb 1.5 oz (4125 g) APGAR: 7, 9  Home with mother.  Holly Gowerolitta M Rubert Frediani, MSN, CNM 03/26/2014, 9:09 AM

## 2014-03-26 NOTE — Discharge Instructions (Signed)
Breast Pumping Tips °Pumping your breast milk is a good way to stimulate milk production and have a steady supply of breast milk for your infant. Pumping is most helpful during your infant's growth spurts, when involving dad or a family member, or when you are away. There are several types of pumps available. They can be purchased at a baby or maternity store. You can begin pumping soon after delivery, but some experts believe that you should wait about four weeks to give your infant a bottle. °In general, the more you breastfeed or pump, the more milk you will have for your infant. It is also important to take good care of yourself. This will reduce stress and help your body to create a healthy supply of milk. Your caregiver or lactation consultant can give you the information and support you need in your efforts to breastfeed your infant. °PUMPING BREAST MILK  °Follow the tips below for successful breast pumping. °Take care of yourself. °· Drink enough water or fluids to keep urine clear or pale yellow. You may notice a thirsty feeling while breastfeeding. This is because your body needs more water to make breast milk. Keep a large water bottle handy. Make healthy drink choices such as unsweetened fruit juice, milk and water. Limit soda, coffee, and alcohol (wait 2 hours to feed or pump if you have an alcoholic drink.) °· Eat a healthy, well-balanced diet rich in fruits, vegetables, and whole grains. °· Exercise as recommended by your caregiver. °· Get plenty of sleep. Sleep when your infant sleeps. Ask friends and family for help if you need time to nap or rest. °· Do not smoke. Smoking can lower your milk supply and harm your infant. If you need help quitting, ask your caregiver for a program recommendation. °· Ask your caregiver about birth control options. Birth control pills may lower your milk supply. You may be advised to use condoms or other forms of birth control. °Relax and pump °Stimulating your  let-down reflex is the key to successful and effective pumping. This makes the milk in all parts of the breast flow more freely.  °· It is easier to pump breast milk (and breastfeed) while you are relaxed. Find techniques that work for you. Quiet private spaces, breast massage, soothing heat placed on the breast, music, and pictures or a tape recording of your infant may help you to relax and "let down" your milk. If you have difficulty with your let down, try smelling one of your infant's blankets or an item of clothing he or she has worn while you are pumping. °· When pumping, place the special suction cup (flange) directly over the nipple. It may be uncomfortable and cause nipple damage if it is not placed properly or is the wrong size. Applying a small amount of purified or modified lanolin to your nipple and the areola may help increase your comfort level. Also, you can change the speed and suction of many electric pumps to your comfort level. Your caregiver or lactation consultant can help you with this. °· If pumping continues to be painful, or you feel you are not getting very much milk when you pump, you may need a different type of pump. A lactation consultant can help you determine if this is the case. °· If you are with your infant, feed him or her on demand and try pumping after each feeding. This will boost your production, even if milk does not come out. You may not be able   to pump much milk at first, but keep up the routine, and this will change. °· If you are working or away from your infant for several hours, try pumping for about 15 minutes every 2 to 3 hours. Pump both breasts at the same time if you can. °· If your infant has a formula feeding, make sure you pump your milk around the same time to maintain your supply. °· Begin pumping breast milk a few weeks before you return to work. This will help you develop techniques that work for you and will be able to store extra milk. °· Find a source  of breastfeeding information that works well for you. °TIPS FOR STORING BREAST MILK °· Store breast milk in a sealable sterile bag, jar, or container provided with your pumping supplies. °· Store milk in small amounts close to what your infant is drinking at each feeding. °· Cool pumped milk in a refrigerator or cooler. Pumped milk can last at the back of the refrigerator for 3 to 8 days. °· Place cooled milk at the back of the freezer for up to 3 months. °· Thaw the milk in its container or bag in warm water up to 24 hours in advance. Do not use a microwave to thaw or heat milk. Do not refreeze the milk after it has been thawed. °· Breast milk is safe to drink when left at room temperature (mid 70s or colder) for 4 to 8 hours. After that, throw it away. °· Milk fat can separate and look funny. The color can vary slightly from day to day. This is normal. Always shake the milk before using it to mix the fat with the more watery portion. °SEEK MEDICAL CARE IF:  °· You are having trouble pumping or feeding your infant. °· You are concerned that you are not making enough milk. °· You have nipple pain, soreness, or redness. °· You have other questions or concerns related to you or your infant. °Document Released: 05/05/2010 Document Revised: 02/07/2012 Document Reviewed: 05/05/2010 °[employer]® Patient Information ©2014 [employer]. °Postpartum Depression and Baby Blues °The postpartum period begins right after the birth of a baby. During this time, there is often a great amount of joy and excitement. It is also a time of considerable changes in the life of the parent(s). Regardless of how many times a mother gives birth, each child brings new challenges and dynamics to the family. It is not unusual to have feelings of excitement accompanied by confusing shifts in moods, emotions, and thoughts. All mothers are at risk of developing postpartum depression or the "baby blues." These mood changes can occur right after giving  birth, or they may occur many months after giving birth. The baby blues or postpartum depression can be mild or severe. Additionally, postpartum depression can resolve rather quickly, or it can be a long-term condition. °CAUSES °Elevated hormones and their rapid decline are thought to be a main cause of postpartum depression and the baby blues. There are a number of hormones that radically change during and after pregnancy. Estrogen and progesterone usually decrease immediately after delivering your baby. The level of thyroid hormone and various cortisol steroids also rapidly drop. Other factors that play a major role in these changes include major life events and genetics.  °RISK FACTORS °If you have any of the following risks for the baby blues or postpartum depression, know what symptoms to watch out for during the postpartum period. Risk factors that may increase the likelihood of   getting the baby blues or postpartum depression include: °· Having a personal or family history of depression. °· Having depression while being pregnant. °· Having premenstrual or oral contraceptive-associated mood issues. °· Having exceptional life stress. °· Having marital conflict. °· Lacking a social support network. °· Having a baby with special needs. °· Having health problems such as diabetes. °SYMPTOMS °Baby blues symptoms include: °· Brief fluctuations in mood, such as going from extreme happiness to sadness. °· Decreased concentration. °· Difficulty sleeping. °· Crying spells, tearfulness. °· Irritability. °· Anxiety. °Postpartum depression symptoms typically begin within the first month after giving birth. These symptoms include: °· Difficulty sleeping or excessive sleepiness. °· Marked weight loss. °· Agitation. °· Feelings of worthlessness. °· Lack of interest in activity or food. °Postpartum psychosis is a very concerning condition and can be dangerous. Fortunately, it is rare. Displaying any of the following symptoms is  cause for immediate medical attention. Postpartum psychosis symptoms include: °· Hallucinations and delusions. °· Bizarre or disorganized behavior. °· Confusion or disorientation. °DIAGNOSIS  °A diagnosis is made by an evaluation of your symptoms. There are no medical or lab tests that lead to a diagnosis, but there are various questionnaires that a caregiver may use to identify those with the baby blues, postpartum depression, or psychosis. Often times, a screening tool called the Edinburgh Postnatal Depression Scale is used to diagnose depression in the postpartum period.  °TREATMENT °The baby blues usually goes away on its own in 1 to 2 weeks. Social support is often all that is needed. You should be encouraged to get adequate sleep and rest. Occasionally, you may be given medicines to help you sleep.  °Postpartum depression requires treatment as it can last several months or longer if it is not treated. Treatment may include individual or group therapy, medicine, or both to address any social, physiological, and psychological factors that may play a role in the depression. Regular exercise, a healthy diet, rest, and social support may also be strongly recommended.  °Postpartum psychosis is more serious and needs treatment right away. Hospitalization is often needed. °HOME CARE INSTRUCTIONS °· Get as much rest as you can. Nap when the baby sleeps. °· Exercise regularly. Some women find yoga and walking to be beneficial. °· Eat a balanced and nourishing diet. °· Do little things that you enjoy. Have a cup of tea, take a bubble bath, read your favorite magazine, or listen to your favorite music. °· Avoid alcohol. °· Ask for help with household chores, cooking, grocery shopping, or running errands as needed. Do not try to do everything. °· Talk to people close to you about how you are feeling. Get support from your partner, family members, friends, or other new moms. °· Try to stay positive in how you think. Think  about the things you are grateful for. °· Do not spend a lot of time alone. °· Only take medicine as directed by your caregiver. °· Keep all your postpartum appointments. °· Let your caregiver know if you have any concerns. °SEEK MEDICAL CARE IF: °You are having a reaction or problems with your medicine. °SEEK IMMEDIATE MEDICAL CARE IF: °· You have suicidal feelings. °· You feel you may harm the baby or someone else. °Document Released: 08/19/2004 Document Revised: 02/07/2012 Document Reviewed: 08/27/2013 °[employer]® Patient Information ©2014 [employer]. °Breastfeeding and Mastitis °Mastitis is inflammation of the breast tissue. It can occur in women who are breastfeeding. This can make breastfeeding painful. Mastitis will sometimes go away on its own. Your   health care provider will help determine if treatment is needed. °CAUSES °Mastitis is often associated with a blocked milk (lactiferous) duct. This can happen when too much milk builds up in the breast. Causes of excess milk in the breast can include: °· Poor latch-on. If your baby is not latched onto the breast properly, she or he may not empty your breast completely while breastfeeding. °· Allowing too much time to pass between feedings. °· Wearing a bra or other clothing that is too tight. This puts extra pressure on the lactiferous ducts so milk does not flow through them as it should. °Mastitis can also be caused by a bacterial infection. Bacteria may enter the breast tissue through cuts or openings in the skin. In women who are breastfeeding, this may occur because of cracked or irritated skin. Cracks in the skin are often caused when your baby does not latch on properly to the breast. °SIGNS AND SYMPTOMS °· Swelling, redness, tenderness, and pain in an area of the breast. °· Swelling of the glands under the arm on the same side. °· Fever may or may not accompany mastitis. °If an infection is allowed to progress, a collection of pus (abscess) may  develop. °DIAGNOSIS  °Your health care provider can usually diagnose mastitis based on your symptoms and a physical exam. Tests may be done to help confirm the diagnosis. These may include: °· Removal of pus from the breast by applying pressure to the area. This pus can be examined in the lab to determine which bacteria are present. If an abscess has developed, the fluid in the abscess can be removed with a needle. This can also be used to confirm the diagnosis and determine the bacteria present. In most cases, pus will not be present. °· Blood tests to determine if your body is fighting a bacterial infection. °· Mammogram or ultrasound tests to rule out other problems or diseases. °TREATMENT  °Mastitis that occurs with breastfeeding will sometimes go away on its own. Your health care provider may choose to wait 24 hours after first seeing you to decide whether a prescription medicine is needed. If your symptoms are worse after 24 hours, your health care provider will likely prescribe an antibiotic to treat the mastitis. He or she will determine which bacteria are most likely causing the infection and will then select an appropriate antibiotic. This is sometimes changed based on the results of tests performed to identify the bacteria, or if there is no response to the antibiotic selected. Antibiotics are usually given by mouth. You may also be given medicine for pain. °HOME CARE INSTRUCTIONS °· Only take over-the-counter or prescription medicines for pain, fever, or discomfort as directed by your health care provider. °· If your health care provider prescribed an antibiotic, take the medicine as directed. Make sure you finish it even if you start to feel better. °· Do not wear a tight or underwire bra. Wear a soft, supportive bra. °· Increase your fluid intake, especially if you have a fever. °· Continue to empty the breast. Your health care provider can tell you whether this milk is safe for your infant or needs to  be thrown out. You may be told to stop nursing until your health care provider thinks it is safe for your baby. Use a breast pump if you are advised to stop nursing. °· Keep your nipples clean and dry. °· Empty the first breast completely before going to the other breast. If your baby is not emptying   your breasts completely for some reason, use a breast pump to empty your breasts. °· If you go back to work, pump your breasts while at work to stay in time with your nursing schedule. °· Avoid allowing your breasts to become overly filled with milk (engorged). °SEEK MEDICAL CARE IF: °· You have pus-like discharge from the breast. °· Your symptoms do not improve with the treatment prescribed by your health care provider within 2 days. °SEEK IMMEDIATE MEDICAL CARE IF: °· Your pain and swelling are getting worse. °· You have pain that is not controlled with medicine. °· You have a red line extending from the breast toward your armpit. °· You have a fever or persistent symptoms for more than 2 3 days. °· You have a fever and your symptoms suddenly get worse. °MAKE SURE YOU:  °· Understand these instructions. °· Will watch your condition. °· Will get help right away if you are not doing well or get worse. °Document Released: 03/12/2005 Document Revised: 07/18/2013 Document Reviewed: 06/21/2013 °[employer]® Patient Information ©2014 [employer]. ° °

## 2014-09-30 ENCOUNTER — Encounter (HOSPITAL_COMMUNITY): Payer: Self-pay

## 2015-11-06 ENCOUNTER — Ambulatory Visit (HOSPITAL_BASED_OUTPATIENT_CLINIC_OR_DEPARTMENT_OTHER)
Admission: RE | Admit: 2015-11-06 | Discharge: 2015-11-06 | Disposition: A | Discharge status: Home / Self Care | Payer: BLUE CROSS/BLUE SHIELD | Source: Ambulatory Visit | Attending: Family Medicine | Admitting: Family Medicine

## 2015-11-06 ENCOUNTER — Encounter: Payer: Self-pay | Admitting: Family Medicine

## 2015-11-06 ENCOUNTER — Ambulatory Visit (INDEPENDENT_AMBULATORY_CARE_PROVIDER_SITE_OTHER): Payer: BLUE CROSS/BLUE SHIELD | Admitting: Family Medicine

## 2015-11-06 VITALS — BP 110/76 | HR 70 | Temp 98.0°F | Resp 20 | Ht 64.5 in | Wt 141.8 lb

## 2015-11-06 DIAGNOSIS — Z0000 Encounter for general adult medical examination without abnormal findings: Secondary | ICD-10-CM | POA: Diagnosis not present

## 2015-11-06 DIAGNOSIS — Z23 Encounter for immunization: Secondary | ICD-10-CM

## 2015-11-06 DIAGNOSIS — Z79899 Other long term (current) drug therapy: Secondary | ICD-10-CM | POA: Insufficient documentation

## 2015-11-06 DIAGNOSIS — M25552 Pain in left hip: Secondary | ICD-10-CM | POA: Insufficient documentation

## 2015-11-06 DIAGNOSIS — M25559 Pain in unspecified hip: Secondary | ICD-10-CM | POA: Insufficient documentation

## 2015-11-06 LAB — CBC WITH DIFFERENTIAL/PLATELET
BASOS ABS: 0 10*3/uL (ref 0.0–0.1)
BASOS PCT: 0.5 % (ref 0.0–3.0)
Eosinophils Absolute: 0.1 10*3/uL (ref 0.0–0.7)
Eosinophils Relative: 1.6 % (ref 0.0–5.0)
HEMATOCRIT: 41 % (ref 36.0–46.0)
HEMOGLOBIN: 13.6 g/dL (ref 12.0–15.0)
LYMPHS PCT: 22.1 % (ref 12.0–46.0)
Lymphs Abs: 1.7 10*3/uL (ref 0.7–4.0)
MCHC: 33.2 g/dL (ref 30.0–36.0)
MCV: 92.4 fl (ref 78.0–100.0)
Monocytes Absolute: 0.5 10*3/uL (ref 0.1–1.0)
Monocytes Relative: 6.2 % (ref 3.0–12.0)
NEUTROS ABS: 5.5 10*3/uL (ref 1.4–7.7)
Neutrophils Relative %: 69.6 % (ref 43.0–77.0)
Platelets: 264 10*3/uL (ref 150.0–400.0)
RBC: 4.44 Mil/uL (ref 3.87–5.11)
RDW: 12.9 % (ref 11.5–15.5)
WBC: 7.9 10*3/uL (ref 4.0–10.5)

## 2015-11-06 LAB — COMPREHENSIVE METABOLIC PANEL
ALBUMIN: 4.3 g/dL (ref 3.5–5.2)
ALT: 10 U/L (ref 0–35)
AST: 15 U/L (ref 0–37)
Alkaline Phosphatase: 38 U/L — ABNORMAL LOW (ref 39–117)
BUN: 13 mg/dL (ref 6–23)
CALCIUM: 9.3 mg/dL (ref 8.4–10.5)
CO2: 25 meq/L (ref 19–32)
Chloride: 104 mEq/L (ref 96–112)
Creatinine, Ser: 0.58 mg/dL (ref 0.40–1.20)
GFR: 125.12 mL/min (ref >60.00)
GLUCOSE: 80 mg/dL (ref 70–99)
Potassium: 4.1 mEq/L (ref 3.5–5.1)
Sodium: 136 mEq/L (ref 135–145)
Total Bilirubin: 0.8 mg/dL (ref 0.2–1.2)
Total Protein: 6.6 g/dL (ref 6.0–8.3)

## 2015-11-06 NOTE — Patient Instructions (Addendum)
Femoroacetabular Impingement stretches. Hip xray as soon as possible orders placed.  Flu shot given today  Health Maintenance, Female Adopting a healthy lifestyle and getting preventive care can go a long way to promote health and wellness. Talk with your health care provider about what schedule of regular examinations is right for you. This is a good chance for you to check in with your provider about disease prevention and staying healthy. In between checkups, there are plenty of things you can do on your own. Experts have done a lot of research about which lifestyle changes and preventive measures are most likely to keep you healthy. Ask your health care provider for more information. WEIGHT AND DIET  Eat a healthy diet  Be sure to include plenty of vegetables, fruits, low-fat dairy products, and lean protein.  Do not eat a lot of foods high in solid fats, added sugars, or salt.  Get regular exercise. This is one of the most important things you can do for your health.  Most adults should exercise for at least 150 minutes each week. The exercise should increase your heart rate and make you sweat (moderate-intensity exercise).  Most adults should also do strengthening exercises at least twice a week. This is in addition to the moderate-intensity exercise.  Maintain a healthy weight  Body mass index (BMI) is a measurement that can be used to identify possible weight problems. It estimates body fat based on height and weight. Your health care provider can help determine your BMI and help you achieve or maintain a healthy weight.  For females 35 years of age and older:   A BMI below 18.5 is considered underweight.  A BMI of 18.5 to 24.9 is normal.  A BMI of 25 to 29.9 is considered overweight.  A BMI of 30 and above is considered obese.  Watch levels of cholesterol and blood lipids  You should start having your blood tested for lipids and cholesterol at 35 years of age, then have  this test every 5 years.  You may need to have your cholesterol levels checked more often if:  Your lipid or cholesterol levels are high.  You are older than 35 years of age.  You are at high risk for heart disease.  CANCER SCREENING   Lung Cancer  Lung cancer screening is recommended for adults 67-14 years old who are at high risk for lung cancer because of a history of smoking.  A yearly low-dose CT scan of the lungs is recommended for people who:  Currently smoke.  Have quit within the past 15 years.  Have at least a 30-pack-year history of smoking. A pack year is smoking an average of one pack of cigarettes a day for 1 year.  Yearly screening should continue until it has been 15 years since you quit.  Yearly screening should stop if you develop a health problem that would prevent you from having lung cancer treatment.  Breast Cancer  Practice breast self-awareness. This means understanding how your breasts normally appear and feel.  It also means doing regular breast self-exams. Let your health care provider know about any changes, no matter how small.  If you are in your 20s or 30s, you should have a clinical breast exam (CBE) by a health care provider every 1-3 years as part of a regular health exam.  If you are 56 or older, have a CBE every year. Also consider having a breast X-ray (mammogram) every year.  If you have a  family history of breast cancer, talk to your health care provider about genetic screening.  If you are at high risk for breast cancer, talk to your health care provider about having an MRI and a mammogram every year.  Breast cancer gene (BRCA) assessment is recommended for women who have family members with BRCA-related cancers. BRCA-related cancers include:  Breast.  Ovarian.  Tubal.  Peritoneal cancers.  Results of the assessment will determine the need for genetic counseling and BRCA1 and BRCA2 testing. Cervical Cancer Your health care  provider may recommend that you be screened regularly for cancer of the pelvic organs (ovaries, uterus, and vagina). This screening involves a pelvic examination, including checking for microscopic changes to the surface of your cervix (Pap test). You may be encouraged to have this screening done every 3 years, beginning at age 2.  For women ages 26-65, health care providers may recommend pelvic exams and Pap testing every 3 years, or they may recommend the Pap and pelvic exam, combined with testing for human papilloma virus (HPV), every 5 years. Some types of HPV increase your risk of cervical cancer. Testing for HPV may also be done on women of any age with unclear Pap test results.  Other health care providers may not recommend any screening for nonpregnant women who are considered low risk for pelvic cancer and who do not have symptoms. Ask your health care provider if a screening pelvic exam is right for you.  If you have had past treatment for cervical cancer or a condition that could lead to cancer, you need Pap tests and screening for cancer for at least 20 years after your treatment. If Pap tests have been discontinued, your risk factors (such as having a new sexual partner) need to be reassessed to determine if screening should resume. Some women have medical problems that increase the chance of getting cervical cancer. In these cases, your health care provider may recommend more frequent screening and Pap tests. Colorectal Cancer  This type of cancer can be detected and often prevented.  Routine colorectal cancer screening usually begins at 35 years of age and continues through 35 years of age.  Your health care provider may recommend screening at an earlier age if you have risk factors for colon cancer.  Your health care provider may also recommend using home test kits to check for hidden blood in the stool.  A small camera at the end of a tube can be used to examine your colon directly  (sigmoidoscopy or colonoscopy). This is done to check for the earliest forms of colorectal cancer.  Routine screening usually begins at age 63.  Direct examination of the colon should be repeated every 5-10 years through 35 years of age. However, you may need to be screened more often if early forms of precancerous polyps or small growths are found. Skin Cancer  Check your skin from head to toe regularly.  Tell your health care provider about any new moles or changes in moles, especially if there is a change in a mole's shape or color.  Also tell your health care provider if you have a mole that is larger than the size of a pencil eraser.  Always use sunscreen. Apply sunscreen liberally and repeatedly throughout the day.  Protect yourself by wearing long sleeves, pants, a wide-brimmed hat, and sunglasses whenever you are outside. HEART DISEASE, DIABETES, AND HIGH BLOOD PRESSURE   High blood pressure causes heart disease and increases the risk of stroke. High blood  pressure is more likely to develop in:  People who have blood pressure in the high end of the normal range (130-139/85-89 mm Hg).  People who are overweight or obese.  People who are African American.  If you are 47-57 years of age, have your blood pressure checked every 3-5 years. If you are 52 years of age or older, have your blood pressure checked every year. You should have your blood pressure measured twice--once when you are at a hospital or clinic, and once when you are not at a hospital or clinic. Record the average of the two measurements. To check your blood pressure when you are not at a hospital or clinic, you can use:  An automated blood pressure machine at a pharmacy.  A home blood pressure monitor.  If you are between 5 years and 26 years old, ask your health care provider if you should take aspirin to prevent strokes.  Have regular diabetes screenings. This involves taking a blood sample to check your  fasting blood sugar level.  If you are at a normal weight and have a low risk for diabetes, have this test once every three years after 35 years of age.  If you are overweight and have a high risk for diabetes, consider being tested at a younger age or more often. PREVENTING INFECTION  Hepatitis B  If you have a higher risk for hepatitis B, you should be screened for this virus. You are considered at high risk for hepatitis B if:  You were born in a country where hepatitis B is common. Ask your health care provider which countries are considered high risk.  Your parents were born in a high-risk country, and you have not been immunized against hepatitis B (hepatitis B vaccine).  You have HIV or AIDS.  You use needles to inject street drugs.  You live with someone who has hepatitis B.  You have had sex with someone who has hepatitis B.  You get hemodialysis treatment.  You take certain medicines for conditions, including cancer, organ transplantation, and autoimmune conditions. Hepatitis C  Blood testing is recommended for:  Everyone born from [birth year] through 1966.  Anyone with known risk factors for hepatitis C. Sexually transmitted infections (STIs)  You should be screened for sexually transmitted infections (STIs) including gonorrhea and chlamydia if:  You are sexually active and are younger than 35 years of age.  You are older than 35 years of age and your health care provider tells you that you are at risk for this type of infection.  Your sexual activity has changed since you were last screened and you are at an increased risk for chlamydia or gonorrhea. Ask your health care provider if you are at risk.  If you do not have HIV, but are at risk, it may be recommended that you take a prescription medicine daily to prevent HIV infection. This is called pre-exposure prophylaxis (PrEP). You are considered at risk if:  You are sexually active and do not regularly use condoms or  know the HIV status of your partner(s).  You take drugs by injection.  You are sexually active with a partner who has HIV. Talk with your health care provider about whether you are at high risk of being infected with HIV. If you choose to begin PrEP, you should first be tested for HIV. You should then be tested every 3 months for as long as you are taking PrEP.  PREGNANCY   If you are premenopausal  and you may become pregnant, ask your health care provider about preconception counseling.  If you may become pregnant, take 400 to 800 micrograms (mcg) of folic acid every day.  If you want to prevent pregnancy, talk to your health care provider about birth control (contraception). OSTEOPOROSIS AND MENOPAUSE   Osteoporosis is a disease in which the bones lose minerals and strength with aging. This can result in serious bone fractures. Your risk for osteoporosis can be identified using a bone density scan.  If you are 8 years of age or older, or if you are at risk for osteoporosis and fractures, ask your health care provider if you should be screened.  Ask your health care provider whether you should take a calcium or vitamin D supplement to lower your risk for osteoporosis.  Menopause may have certain physical symptoms and risks.  Hormone replacement therapy may reduce some of these symptoms and risks. Talk to your health care provider about whether hormone replacement therapy is right for you.  HOME CARE INSTRUCTIONS   Schedule regular health, dental, and eye exams.  Stay current with your immunizations.   Do not use any tobacco products including cigarettes, chewing tobacco, or electronic cigarettes.  If you are pregnant, do not drink alcohol.  If you are breastfeeding, limit how much and how often you drink alcohol.  Limit alcohol intake to no more than 1 drink per day for nonpregnant women. One drink equals 12 ounces of beer, 5 ounces of wine, or 1 ounces of hard liquor.  Do  not use street drugs.  Do not share needles.  Ask your health care provider for help if you need support or information about quitting drugs.  Tell your health care provider if you often feel depressed.  Tell your health care provider if you have ever been abused or do not feel safe at home.   This information is not intended to replace advice given to you by your health care provider. Make sure you discuss any questions you have with your health care provider.   Document Released: 05/31/2011 Document Revised: 12/06/2014 Document Reviewed: 10/17/2013 Elsevier Interactive Patient Education [employer].
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
## 2015-11-07 ENCOUNTER — Telehealth: Payer: Self-pay | Admitting: Family Medicine

## 2015-11-07 ENCOUNTER — Other Ambulatory Visit: Payer: Self-pay | Admitting: Family Medicine

## 2015-11-07 DIAGNOSIS — M25552 Pain in left hip: Secondary | ICD-10-CM

## 2015-11-07 NOTE — Telephone Encounter (Signed)
Spoke with patient reviewed lab and xray results and instructions. Patient verbalized understanding. 

## 2015-11-07 NOTE — Telephone Encounter (Signed)
Please call pt: - her labs are normal. - her hip xray is also so normal. This just rules out fractures etc, this does not rule out hip impingement or labral tear. The next step is to continue to with NSAIDS (Advil/aleve) and physical therapy to calm the inflammation and strengthen/align the hip. If that is not affective and pain continues or worsens, we will need to refer to ortho.

## 2015-11-07 NOTE — Telephone Encounter (Signed)
Left message for patient to return call.

## 2015-11-17 ENCOUNTER — Encounter: Payer: Self-pay | Admitting: Family Medicine

## 2016-03-18 DIAGNOSIS — F411 Generalized anxiety disorder: Secondary | ICD-10-CM | POA: Diagnosis not present

## 2016-03-25 DIAGNOSIS — F331 Major depressive disorder, recurrent, moderate: Secondary | ICD-10-CM | POA: Diagnosis not present

## 2016-06-03 DIAGNOSIS — F411 Generalized anxiety disorder: Secondary | ICD-10-CM | POA: Diagnosis not present

## 2016-06-23 DIAGNOSIS — Z01419 Encounter for gynecological examination (general) (routine) without abnormal findings: Secondary | ICD-10-CM | POA: Diagnosis not present

## 2016-06-23 DIAGNOSIS — Z6824 Body mass index (BMI) 24.0-24.9, adult: Secondary | ICD-10-CM | POA: Diagnosis not present

## 2016-06-23 DIAGNOSIS — Z1151 Encounter for screening for human papillomavirus (HPV): Secondary | ICD-10-CM | POA: Diagnosis not present

## 2016-06-23 DIAGNOSIS — R87613 High grade squamous intraepithelial lesion on cytologic smear of cervix (HGSIL): Secondary | ICD-10-CM | POA: Diagnosis not present

## 2016-10-01 DIAGNOSIS — H6691 Otitis media, unspecified, right ear: Secondary | ICD-10-CM | POA: Diagnosis not present

## 2016-10-01 DIAGNOSIS — R0981 Nasal congestion: Secondary | ICD-10-CM | POA: Diagnosis not present

## 2016-11-01 DIAGNOSIS — F411 Generalized anxiety disorder: Secondary | ICD-10-CM | POA: Diagnosis not present

## 2017-05-02 DIAGNOSIS — F411 Generalized anxiety disorder: Secondary | ICD-10-CM | POA: Diagnosis not present

## 2017-05-03 ENCOUNTER — Encounter: Payer: Self-pay | Admitting: Family Medicine

## 2017-05-03 ENCOUNTER — Ambulatory Visit (INDEPENDENT_AMBULATORY_CARE_PROVIDER_SITE_OTHER): Payer: BLUE CROSS/BLUE SHIELD | Admitting: Family Medicine

## 2017-05-03 VITALS — BP 103/69 | HR 54 | Temp 97.7°F | Resp 20 | Ht 65.0 in | Wt 142.0 lb

## 2017-05-03 DIAGNOSIS — Z79899 Other long term (current) drug therapy: Secondary | ICD-10-CM | POA: Diagnosis not present

## 2017-05-03 DIAGNOSIS — F418 Other specified anxiety disorders: Secondary | ICD-10-CM

## 2017-05-03 DIAGNOSIS — Z975 Presence of (intrauterine) contraceptive device: Secondary | ICD-10-CM | POA: Diagnosis not present

## 2017-05-03 DIAGNOSIS — Z0000 Encounter for general adult medical examination without abnormal findings: Secondary | ICD-10-CM

## 2017-05-03 LAB — COMPREHENSIVE METABOLIC PANEL
ALBUMIN: 4.7 g/dL (ref 3.5–5.2)
ALK PHOS: 40 U/L (ref 39–117)
ALT: 10 U/L (ref 0–35)
AST: 17 U/L (ref 0–37)
BUN: 12 mg/dL (ref 6–23)
CO2: 28 mEq/L (ref 19–32)
Calcium: 9.3 mg/dL (ref 8.4–10.5)
Chloride: 104 mEq/L (ref 96–112)
Creatinine, Ser: 0.69 mg/dL (ref 0.40–1.20)
GFR: 101.56 mL/min (ref >60.00)
GLUCOSE: 84 mg/dL (ref 70–99)
POTASSIUM: 4 meq/L (ref 3.5–5.1)
SODIUM: 138 meq/L (ref 135–145)
TOTAL PROTEIN: 6.9 g/dL (ref 6.0–8.3)
Total Bilirubin: 1.1 mg/dL (ref 0.2–1.2)

## 2017-05-03 LAB — CBC WITH DIFFERENTIAL/PLATELET
BASOS PCT: 0.9 % (ref 0.0–3.0)
Basophils Absolute: 0 10*3/uL (ref 0.0–0.1)
EOS PCT: 4 % (ref 0.0–5.0)
Eosinophils Absolute: 0.2 10*3/uL (ref 0.0–0.7)
HCT: 39.5 % (ref 36.0–46.0)
Hemoglobin: 13.1 g/dL (ref 12.0–15.0)
LYMPHS ABS: 1.6 10*3/uL (ref 0.7–4.0)
Lymphocytes Relative: 34.3 % (ref 12.0–46.0)
MCHC: 33.3 g/dL (ref 30.0–36.0)
MCV: 93 fl (ref 78.0–100.0)
MONO ABS: 0.3 10*3/uL (ref 0.1–1.0)
MONOS PCT: 6.7 % (ref 3.0–12.0)
NEUTROS PCT: 54.1 % (ref 43.0–77.0)
Neutro Abs: 2.5 10*3/uL (ref 1.4–7.7)
Platelets: 284 10*3/uL (ref 150.0–400.0)
RBC: 4.24 Mil/uL (ref 3.87–5.11)
RDW: 12.9 % (ref 11.5–15.5)
WBC: 4.7 10*3/uL (ref 4.0–10.5)

## 2017-05-03 NOTE — Progress Notes (Signed)
Subjective:    Patient ID: Holly Medina, female    DOB: 1981-04-25, 36 y.o.   MRN: [id number]  Patient Care Team    Relationship Specialty Notifications Start End  Ma Hillock, DO PCP - General Family Medicine  11/06/15   Brien Few, MD Consulting Physician Obstetrics and Gynecology  05/03/17     Chief Complaint  Patient presents with  . Annual Exam    HPI  Holly Medina is 36 y.o. female present for CPE today. Patient denies any complaints.  Depression with anxiety: Patient has been stable with her depression and anxiety on Zoloft 200 mg daily. She has had a change in her psychiatry provider secondary to her prior psychiatrist changing positions. She has had one appointment with her new provider [name], FNP. She so far feels like this is a good fit for her and has routine follow-up schedule. Zoloft medications are provided through her psychiatrist.  Health maintenance:  Colonoscopy: No fhx, screen 50.  Mammogram: No fhx, start screen at 40. Had one a few years ago, normal. Dr. Ronita Hipps.  Cervical cancer screening: 05/2016; WNL, Dr. Ronita Hipps. IUD 2015 Mirena. Immunizations: tdap 01/21/2014 UTD, Flu shot yearly last 2016. Encouraged yearly.  Infectious disease screening: HIV screen completed 2014. Patient has a dental home. No Hospital or ED admissions.   Past Medical History:  Diagnosis Date  . Abnormal Pap smear   . Abnormal Pap smear of cervix 10/2006   HGSIL (dr. [facility])  . Allergy    seasonal  . Anxiety   . Asthma   . Blood type B+   . Depression   . Hemorrhoid   . History of chicken pox   . Postpartum care following vaginal delivery (11/20) 10/20/2011  . Vaginal Pap smear, abnormal    No Known Allergies Past Surgical History:  Procedure Laterality Date  . COLPOSCOPY  2009   benign   Family History  Problem Relation Age of Onset  . Asthma Mother   . Hypertension Father   . Multiple sclerosis Maternal Grandmother   . Cancer  Maternal Grandfather        lung  . Cancer Paternal Uncle        lung cancer/smoker  . COPD Paternal Grandfather   . Colon cancer Neg Hx   . Breast cancer Neg Hx    Social History   Social History  . Marital status: Married    Spouse name: N/A  . Number of children: N/A  . Years of education: N/A   Occupational History  . Not on file.   Social History Main Topics  . Smoking status: Former Smoker    Types: Cigarettes    Quit date: 11/29/2002  . Smokeless tobacco: Never Used  . Alcohol use 0.5 oz/week    1 Standard drinks or equivalent per week  . Drug use: No  . Sexual activity: Yes   Other Topics Concern  . Not on file   Social History Narrative   Married. Husband's name is [name]. Has 3 children, [name] of science degree. 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   Drink caffeinated beverages.   Wears her seatbelt, exercises at least 3 times a week.   Smoke detector in the home. Firearms in the home, and a locked cabinet.   Patient feels safe at her relationships.    Current Outpatient Prescriptions:  .  ibuprofen (ADVIL,MOTRIN) 600 MG tablet, Take 1  tablet (600 mg total) by mouth every 6 (six) hours., Disp: 30 tablet, Rfl: 0 .  sertraline (ZOLOFT) 100 MG tablet, Take 200 mg by mouth daily., Disp: , Rfl:    Review of Systems Negative, with the exception of above mentioned in HPI      Objective:   Physical Exam BP 103/69 (BP Location: Right Arm, Patient Position: Sitting, Cuff Size: Normal)   Pulse (!) 54   Temp 97.7 F (36.5 C)   Resp 20   Ht '5\' 5"'$  (1.651 m)   Wt 142 lb (64.4 kg)   SpO2 99%   BMI 23.63 kg/m   Gen: Afebrile. No acute distress. Nontoxic in appearance, well-developed, well-nourished, very pleasant Caucasian female. HENT: AT. [place]. Bilateral TM visualized and normal in appearance. MMM. Bilateral nares without erythema or swelling. Throat without erythema or exudates. No cough, no  hoarseness. Eyes:Pupils Equal Round Reactive to light, Extraocular movements intact,  Conjunctiva without redness, discharge or icterus. Neck/lymp/endocrine: Supple, one small pea-sized lymph node right superior inguinal area. No thyromegaly CV: RRR no murmur, no edema, +2/4 P posterior tibialis pulses. No carotid bruit. No JVD. Chest: CTAB, no wheeze or crackles Abd: Soft. Flat. NTND. BS present. No Masses palpated.  MSK: No obvious deformities, full range of motion. Neurovascular intact distally. Skin: No rashes, purpura or petechiae. Warm and well-perfused, intact. Neuro:  Normal gait. PERLA. EOMi. Alert. Oriented. Cranial nerves II through XII intact. Muscle strength 5/5 upper and lower extremity. DTRs equal bilaterally. Psych: Normal affect, dress and demeanor. Normal speech. Normal thought content and judgment.     Assessment & Plan:  ADELEI SCOBEY is a 36 y.o. female present for CPE. Encounter for preventive health examination Colonoscopy: No fhx, screen at 50.  Mammogram: No fhx, start screen at 40.  Cervical cancer screening: 05/2016; WNL, Dr. Ronita Hipps. IUD 2015 Mirena. Immunizations: tdap 01/21/2014 UTD, Flu shot yearly last 2016. Encouraged yearly.  Infectious disease screening: HIV screen completed 2014. - CBC w/Diff - Comp Met (CMET)  IUD (intrauterine device) in place - Place 2015, Dr. Ronita Hipps Continue routine follow-ups. Depression with anxiety - Stable on Zoloft 200 mg daily, started with a new provider [name] FNP. - She seems to be comfortable with provider. Did discuss if for some reason she wanted to change providers, a new psychology provider is starting at this location. Currently she seems to have good fit with Santiago Glad. All medications provided through [name] FNP. Encounter for long-term (current) use of medications - CBC w/Diff - Comp Met (CMET)   Return in about 1 year (around 05/03/2018) for CPE.   Electronically Signed by: Howard Pouch,  DO [facility]- OR   .

## 2017-05-03 NOTE — Patient Instructions (Signed)

## 2017-05-04 ENCOUNTER — Telehealth: Payer: Self-pay | Admitting: Family Medicine

## 2017-05-04 NOTE — Telephone Encounter (Signed)
Please call pt: - her labs are normal.  

## 2017-05-04 NOTE — Telephone Encounter (Signed)
Spoke with patient reviewed lab results. 

## 2017-08-31 DIAGNOSIS — Z01419 Encounter for gynecological examination (general) (routine) without abnormal findings: Secondary | ICD-10-CM | POA: Diagnosis not present

## 2017-08-31 DIAGNOSIS — Z6825 Body mass index (BMI) 25.0-25.9, adult: Secondary | ICD-10-CM | POA: Diagnosis not present

## 2017-11-02 DIAGNOSIS — F411 Generalized anxiety disorder: Secondary | ICD-10-CM | POA: Diagnosis not present

## 2017-11-28 DIAGNOSIS — N644 Mastodynia: Secondary | ICD-10-CM | POA: Diagnosis not present

## 2018-02-23 DIAGNOSIS — J019 Acute sinusitis, unspecified: Secondary | ICD-10-CM | POA: Diagnosis not present

## 2018-03-01 DIAGNOSIS — F411 Generalized anxiety disorder: Secondary | ICD-10-CM | POA: Diagnosis not present

## 2018-08-23 ENCOUNTER — Ambulatory Visit (INDEPENDENT_AMBULATORY_CARE_PROVIDER_SITE_OTHER): Payer: BLUE CROSS/BLUE SHIELD | Admitting: Family Medicine

## 2018-08-23 ENCOUNTER — Encounter: Payer: Self-pay | Admitting: Family Medicine

## 2018-08-23 VITALS — BP 105/68 | HR 57 | Temp 97.6°F | Resp 16 | Ht 65.0 in | Wt 153.4 lb

## 2018-08-23 DIAGNOSIS — Z23 Encounter for immunization: Secondary | ICD-10-CM

## 2018-08-23 DIAGNOSIS — M545 Low back pain, unspecified: Secondary | ICD-10-CM

## 2018-08-23 MED ORDER — TRAMADOL HCL 50 MG PO TABS: ORAL_TABLET | ORAL | 0 refills | Status: DC

## 2018-08-23 MED ORDER — CYCLOBENZAPRINE HCL 10 MG PO TABS: ORAL_TABLET | ORAL | 0 refills | Status: DC

## 2018-08-23 NOTE — Patient Instructions (Signed)
Low Back Sprain Rehab  Ask your health care provider which exercises are safe for you. Do exercises exactly as told by your health care provider and adjust them as directed. It is normal to feel mild stretching, pulling, tightness, or discomfort as you do these exercises, but you should stop right away if you feel sudden pain or your pain gets worse. Do not begin these exercises until told by your health care provider.  Stretching and range of motion exercises  These exercises warm up your muscles and joints and improve the movement and flexibility of your back. These exercises also help to relieve pain, numbness, and tingling.  Exercise A: Lumbar rotation    1. Lie on your back on a firm surface and bend your knees.  2. Straighten your arms out to your sides so each arm forms an "L" shape with a side of your body (a 90 degree angle).  3. Slowly move both of your knees to one side of your body until you feel a stretch in your lower back. Try not to let your shoulders move off of the floor.  4. Hold for __________ seconds.  5. Tense your abdominal muscles and slowly move your knees back to the starting position.  6. Repeat this exercise on the other side of your body.  Repeat __________ times. Complete this exercise __________ times a day.  Exercise B: Prone extension on elbows    1. Lie on your abdomen on a firm surface.  2. Prop yourself up on your elbows.  3. Use your arms to help lift your chest up until you feel a gentle stretch in your abdomen and your lower back.  ? This will place some of your body weight on your elbows. If this is uncomfortable, try stacking pillows under your chest.  ? Your hips should stay down, against the surface that you are lying on. Keep your hip and back muscles relaxed.  4. Hold for __________ seconds.  5. Slowly relax your upper body and return to the starting position.  Repeat __________ times. Complete this exercise __________ times a day.  Strengthening exercises  These  exercises build strength and endurance in your back. Endurance is the ability to use your muscles for a long time, even after they get tired.  Exercise C: Pelvic tilt  1. Lie on your back on a firm surface. Bend your knees and keep your feet flat.  2. Tense your abdominal muscles. Tip your pelvis up toward the ceiling and flatten your lower back into the floor.  ? To help with this exercise, you may place a small towel under your lower back and try to push your back into the towel.  3. Hold for __________ seconds.  4. Let your muscles relax completely before you repeat this exercise.  Repeat __________ times. Complete this exercise __________ times a day.  Exercise D: Alternating arm and leg raises    1. Get on your hands and knees on a firm surface. If you are on a hard floor, you may want to use padding to cushion your knees, such as an exercise mat.  2. Line up your arms and legs. Your hands should be below your shoulders, and your knees should be below your hips.  3. Lift your left leg behind you. At the same time, raise your right arm and straighten it in front of you.  ? Do not lift your leg higher than your hip.  ? Do not lift your arm   higher than your shoulder.  ? Keep your abdominal and back muscles tight.  ? Keep your hips facing the ground.  ? Do not arch your back.  ? Keep your balance carefully, and do not hold your breath.  4. Hold for __________ seconds.  5. Slowly return to the starting position and repeat with your right leg and your left arm.  Repeat __________ times. Complete this exercise __________ times a day.  Exercise E: Abdominal set with straight leg raise    1. Lie on your back on a firm surface.  2. Bend one of your knees and keep your other leg straight.  3. Tense your abdominal muscles and lift your straight leg up, 4-6 inches (10-15 cm) off the ground.  4. Keep your abdominal muscles tight and hold for __________ seconds.  ? Do not hold your breath.  ? Do not arch your back. Keep it  flat against the ground.  5. Keep your abdominal muscles tense as you slowly lower your leg back to the starting position.  6. Repeat with your other leg.  Repeat __________ times. Complete this exercise __________ times a day.  Posture and body mechanics    Body mechanics refers to the movements and positions of your body while you do your daily activities. Posture is part of body mechanics. Good posture and healthy body mechanics can help to relieve stress in your body's tissues and joints. Good posture means that your spine is in its natural S-curve position (your spine is neutral), your shoulders are pulled back slightly, and your head is not tipped forward. The following are general guidelines for applying improved posture and body mechanics to your everyday activities.  Standing    · When standing, keep your spine neutral and your feet about hip-width apart. Keep a slight bend in your knees. Your ears, shoulders, and hips should line up.  · When you do a task in which you stand in one place for a long time, place one foot up on a stable object that is 2-4 inches (5-10 cm) high, such as a footstool. This helps keep your spine neutral.  Sitting    · When sitting, keep your spine neutral and keep your feet flat on the floor. Use a footrest, if necessary, and keep your thighs parallel to the floor. Avoid rounding your shoulders, and avoid tilting your head forward.  · When working at a desk or a computer, keep your desk at a height where your hands are slightly lower than your elbows. Slide your chair under your desk so you are close enough to maintain good posture.  · When working at a computer, place your monitor at a height where you are looking straight ahead and you do not have to tilt your head forward or downward to look at the screen.  Resting    · When lying down and resting, avoid positions that are most painful for you.  · If you have pain with activities such as sitting, bending, stooping, or squatting  (flexion-based activities), lie in a position in which your body does not bend very much. For example, avoid curling up on your side with your arms and knees near your chest (fetal position).  · If you have pain with activities such as standing for a long time or reaching with your arms (extension-based activities), lie with your spine in a neutral position and bend your knees slightly. Try the following positions:  · Lying on your side with a   pillow between your knees.  · Lying on your back with a pillow under your knees.  Lifting    · When lifting objects, keep your feet at least shoulder-width apart and tighten your abdominal muscles.  · Bend your knees and hips and keep your spine neutral. It is important to lift using the strength of your legs, not your back. Do not lock your knees straight out.  · Always ask for help to lift heavy or awkward objects.  This information is not intended to replace advice given to you by your health care provider. Make sure you discuss any questions you have with your health care provider.  Document Released: 11/15/2005 Document Revised: 07/22/2016 Document Reviewed: 08/27/2015  Elsevier Interactive Patient Education © 2018 [employer]

## 2018-08-23 NOTE — Progress Notes (Signed)
OFFICE VISIT  08/23/2018   CC:  Chief Complaint  Patient presents with  . Back Pain    lower   HPI:    Patient is a 37 y.o. Caucasian female patient of Dr. Claiborne Billings who presents for lower back pain. Onset about 10 d/a, began hurting in low back diffusely and both hip areas around iliac crest regions.  No spasms.  Bending forward worsens the pain. Lying flat on heating pad is good.  Sitting up in car bother, has progressed. Went to [employer] and relaxed and it got a little better.  Then went back to work yesterday and everything got bad again. No radiation into legs.  No paresthesias.  No weakness.  No hx of back trauma or significant injury. Teaches pre-K, bends over a lot.   Past Medical History:  Diagnosis Date  . Abnormal Pap smear   . Abnormal Pap smear of cervix 10/2006   [facility] (dr. Merilynn [facility])  . Allergy    seasonal  . Anxiety   . Asthma   . Blood type B+   . Depression   . Hemorrhoid   . History of chicken pox   . Postpartum care following vaginal delivery (11/20) 10/20/2011  . Vaginal Pap smear, abnormal     Past Surgical History:  Procedure Laterality Date  . COLPOSCOPY  2009   benign    Outpatient Medications Prior to Visit  Medication Sig Dispense Refill  . sertraline (ZOLOFT) 100 MG tablet Take 200 mg by mouth daily.    Marland Kitchen ibuprofen (ADVIL,MOTRIN) 600 MG tablet Take 1 tablet (600 mg total) by mouth every 6 (six) hours. 30 tablet 0   No facility-administered medications prior to visit.     No Known Allergies  ROS As per HPI  PE: Blood pressure 105/68, pulse (!) 57, temperature 97.6 F (36.4 C), temperature source Oral, resp. rate 16, height 5\' 5"  (1.651 m), weight 153 lb 6 oz (69.6 kg), SpO2 100 %, unknown if currently breastfeeding.  Pt examined with Pryor Ochoa, CMA, as chaperone.  Gen: Alert, well appearing.  Patient is oriented to person, place, time, and situation. AFFECT: pleasant, lucid thought and speech. BACK EXAM:  Patient  moves about the exam room without particular difficulty or stooped posture. Spine appears straight, back without bruising or other skin abnormality. Pt demonstrates normal ROM of L spine extension, lateral flexion, and rotation, but can only flex at L spine to about75 deg due to pain in LB.  Minimal pulling discomfort with lateral bending to R and rotation to R.  Extension makes her back feel better.   Palpation of back reveals mild tenderness over spinous processes and in paraspinous soft tissues of lower L spine diffusely.  No focal TTP over facet joint regions.  No step-offs. No SI joint tenderness.  No tenderness of iliac crests or greater troch regions. SLR neg bilaterally.  LE strength 5/5 in proximal and distal muscles bilaterally. DTRs: 2+ patellar, trace achilles, bilaterally.   FABER: no SI region pain.     LABS:    Chemistry      Component Value Date/Time   NA 138 05/03/2017 1129   K 4.0 05/03/2017 1129   CL 104 05/03/2017 1129   CO2 28 05/03/2017 1129   BUN 12 05/03/2017 1129   CREATININE 0.69 05/03/2017 1129      Component Value Date/Time   CALCIUM 9.3 05/03/2017 1129   ALKPHOS 40 05/03/2017 1129   AST 17 05/03/2017 1129   ALT 10  05/03/2017 1129   BILITOT 1.1 05/03/2017 1129      IMPRESSION AND PLAN:  Acute musculoskeletal low back strain. Discussed dx.  No imaging indicated. NSAIDs for about 10+ days-->no help so we'll stop these meds. Home PT regimen given to pt, recommended heating pad to area 20 min qd-bid. Tramadol 50-100 mg bid prn, #30, no RF.  Therapeutic expectations and side effect profile of medication discussed today.  Patient's questions answered. I recommended she try flexeril and see if possibly it helps her better than tramadol (she knows not to use both at the same time).  See orders.  An After Visit Summary was printed and given to the patient.  FOLLOW UP: Return if symptoms worsen or fail to improve.Pt chose to leave follow up open  ended. Signs/symptoms to call or return for were reviewed and pt expressed understanding.  Signed:  Santiago Bumpers, MD           08/23/2018

## 2018-08-30 DIAGNOSIS — F411 Generalized anxiety disorder: Secondary | ICD-10-CM | POA: Diagnosis not present

## 2018-10-20 DIAGNOSIS — Z6825 Body mass index (BMI) 25.0-25.9, adult: Secondary | ICD-10-CM | POA: Diagnosis not present

## 2018-10-20 DIAGNOSIS — Z01419 Encounter for gynecological examination (general) (routine) without abnormal findings: Secondary | ICD-10-CM | POA: Diagnosis not present

## 2020-02-06 LAB — HM MAMMOGRAPHY

## 2020-02-06 LAB — HM PAP SMEAR

## 2020-02-21 ENCOUNTER — Telehealth: Payer: Self-pay

## 2020-02-21 NOTE — Telephone Encounter (Signed)
Patient is requesting appt tomorrow for rib pain she has had for a couple of weeks. She is concerned and does not want to leave out of town without Dr. Claiborne Billings examine patient.  She works until [employer] and would need an afternoon appt.  Please advise. Patient can be reached at [phone].

## 2020-02-21 NOTE — Telephone Encounter (Signed)
Pt was called told we were booked for tomorrow but we could schedule ASAP. She wanted to book the week after next as she is going on vacation. Pt was offered appt with another MD and she declined and stated "it has been going on a couple weeks now so I can wait". She was told if pain gets worse to go to ED/urgent care, she verbalized understanding

## 2020-03-04 ENCOUNTER — Ambulatory Visit: Payer: BLUE CROSS/BLUE SHIELD | Admitting: Family Medicine

## 2020-03-12 ENCOUNTER — Other Ambulatory Visit: Payer: Self-pay

## 2020-03-12 ENCOUNTER — Ambulatory Visit (INDEPENDENT_AMBULATORY_CARE_PROVIDER_SITE_OTHER): Payer: Commercial Managed Care - PPO | Admitting: Family Medicine

## 2020-03-12 ENCOUNTER — Encounter: Payer: Self-pay | Admitting: Family Medicine

## 2020-03-12 VITALS — BP 101/70 | HR 62 | Temp 98.2°F | Resp 16 | Ht 64.0 in | Wt 150.1 lb

## 2020-03-12 DIAGNOSIS — Z131 Encounter for screening for diabetes mellitus: Secondary | ICD-10-CM | POA: Diagnosis not present

## 2020-03-12 DIAGNOSIS — Z975 Presence of (intrauterine) contraceptive device: Secondary | ICD-10-CM

## 2020-03-12 DIAGNOSIS — Z0000 Encounter for general adult medical examination without abnormal findings: Secondary | ICD-10-CM

## 2020-03-12 DIAGNOSIS — Z130 Encounter for screening for diseases of the blood and blood-forming organs and certain disorders involving the immune mechanism: Secondary | ICD-10-CM

## 2020-03-12 DIAGNOSIS — F418 Other specified anxiety disorders: Secondary | ICD-10-CM | POA: Diagnosis not present

## 2020-03-12 DIAGNOSIS — Z793 Long term (current) use of hormonal contraceptives: Secondary | ICD-10-CM | POA: Insufficient documentation

## 2020-03-12 DIAGNOSIS — E663 Overweight: Secondary | ICD-10-CM | POA: Diagnosis not present

## 2020-03-12 LAB — COMPREHENSIVE METABOLIC PANEL
ALT: 12 U/L (ref 0–35)
AST: 18 U/L (ref 0–37)
Albumin: 4.4 g/dL (ref 3.5–5.2)
Alkaline Phosphatase: 51 U/L (ref 39–117)
BUN: 16 mg/dL (ref 6–23)
CO2: 26 mEq/L (ref 19–32)
Calcium: 9.1 mg/dL (ref 8.4–10.5)
Chloride: 105 mEq/L (ref 96–112)
Creatinine, Ser: 0.66 mg/dL (ref 0.40–1.20)
GFR: 99.09 mL/min (ref >60.00)
Glucose, Bld: 87 mg/dL (ref 70–99)
Potassium: 4.5 mEq/L (ref 3.5–5.1)
Sodium: 137 mEq/L (ref 135–145)
Total Bilirubin: 0.4 mg/dL (ref 0.2–1.2)
Total Protein: 6.5 g/dL (ref 6.0–8.3)

## 2020-03-12 LAB — LIPID PANEL
Cholesterol: 181 mg/dL (ref 0–200)
HDL: 46.5 mg/dL (ref >39.00)
LDL Cholesterol: 124 mg/dL — ABNORMAL HIGH (ref 0–99)
NonHDL: 134.04
Total CHOL/HDL Ratio: 4
Triglycerides: 50 mg/dL (ref 0.0–149.0)
VLDL: 10 mg/dL (ref 0.0–40.0)

## 2020-03-12 LAB — CBC
HCT: 38.8 % (ref 36.0–46.0)
Hemoglobin: 13.1 g/dL (ref 12.0–15.0)
MCHC: 33.6 g/dL (ref 30.0–36.0)
MCV: 94 fl (ref 78.0–100.0)
Platelets: 263 10*3/uL (ref 150.0–400.0)
RBC: 4.13 Mil/uL (ref 3.87–5.11)
RDW: 12.9 % (ref 11.5–15.5)
WBC: 4.7 10*3/uL (ref 4.0–10.5)

## 2020-03-12 LAB — TSH: TSH: 0.92 u[IU]/mL (ref 0.35–4.50)

## 2020-03-12 LAB — HEMOGLOBIN A1C: Hgb A1c MFr Bld: 5 % (ref 4.6–6.5)

## 2020-03-12 NOTE — Patient Instructions (Signed)
Health Maintenance, Female Adopting a healthy lifestyle and getting preventive care are important in promoting health and wellness. Ask your health care provider about:  The right schedule for you to have regular tests and exams.  Things you can do on your own to prevent diseases and keep yourself healthy. What should I know about diet, weight, and exercise? Eat a healthy diet   Eat a diet that includes plenty of vegetables, fruits, low-fat dairy products, and lean protein.  Do not eat a lot of foods that are high in solid fats, added sugars, or sodium. Maintain a healthy weight Body mass index (BMI) is used to identify weight problems. It estimates body fat based on height and weight. Your health care provider can help determine your BMI and help you achieve or maintain a healthy weight. Get regular exercise Get regular exercise. This is one of the most important things you can do for your health. Most adults should:  Exercise for at least 150 minutes each week. The exercise should increase your heart rate and make you sweat (moderate-intensity exercise).  Do strengthening exercises at least twice a week. This is in addition to the moderate-intensity exercise.  Spend less time sitting. Even light physical activity can be beneficial. Watch cholesterol and blood lipids Have your blood tested for lipids and cholesterol at 40 years of age, then have this test every 5 years. Have your cholesterol levels checked more often if:  Your lipid or cholesterol levels are high.  You are older than 40 years of age.  You are at high risk for heart disease. What should I know about cancer screening? Depending on your health history and family history, you may need to have cancer screening at various ages. This may include screening for:  Breast cancer.  Cervical cancer.  Colorectal cancer.  Skin cancer.  Lung cancer. What should I know about heart disease, diabetes, and high blood  pressure? Blood pressure and heart disease  High blood pressure causes heart disease and increases the risk of stroke. This is more likely to develop in people who have high blood pressure readings, are of African descent, or are overweight.  Have your blood pressure checked: ? Every 3-5 years if you are 18-39 years of age. ? Every year if you are 40 years old or older. Diabetes Have regular diabetes screenings. This checks your fasting blood sugar level. Have the screening done:  Once every three years after age 40 if you are at a normal weight and have a low risk for diabetes.  More often and at a younger age if you are overweight or have a high risk for diabetes. What should I know about preventing infection? Hepatitis B If you have a higher risk for hepatitis B, you should be screened for this virus. Talk with your health care provider to find out if you are at risk for hepatitis B infection. Hepatitis C Testing is recommended for:  Everyone born from 1945 through 1965.  Anyone with known risk factors for hepatitis C. Sexually transmitted infections (STIs)  Get screened for STIs, including gonorrhea and chlamydia, if: ? You are sexually active and are younger than 40 years of age. ? You are older than 40 years of age and your health care provider tells you that you are at risk for this type of infection. ? Your sexual activity has changed since you were last screened, and you are at increased risk for chlamydia or gonorrhea. Ask your health care provider if   you are at risk.  Ask your health care provider about whether you are at high risk for HIV. Your health care provider may recommend a prescription medicine to help prevent HIV infection. If you choose to take medicine to prevent HIV, you should first get tested for HIV. You should then be tested every 3 months for as long as you are taking the medicine. Pregnancy  If you are about to stop having your period (premenopausal) and  you may become pregnant, seek counseling before you get pregnant.  Take 400 to 800 micrograms (mcg) of folic acid every day if you become pregnant.  Ask for birth control (contraception) if you want to prevent pregnancy. Osteoporosis and menopause Osteoporosis is a disease in which the bones lose minerals and strength with aging. This can result in bone fractures. If you are 65 years old or older, or if you are at risk for osteoporosis and fractures, ask your health care provider if you should:  Be screened for bone loss.  Take a calcium or vitamin D supplement to lower your risk of fractures.  Be given hormone replacement therapy (HRT) to treat symptoms of menopause. Follow these instructions at home: Lifestyle  Do not use any products that contain nicotine or tobacco, such as cigarettes, e-cigarettes, and chewing tobacco. If you need help quitting, ask your health care provider.  Do not use street drugs.  Do not share needles.  Ask your health care provider for help if you need support or information about quitting drugs. Alcohol use  Do not drink alcohol if: ? Your health care provider tells you not to drink. ? You are pregnant, may be pregnant, or are planning to become pregnant.  If you drink alcohol: ? Limit how much you use to 0-1 drink a day. ? Limit intake if you are breastfeeding.  Be aware of how much alcohol is in your drink. In the [place] one drink equals one 12 oz bottle of beer (355 mL), one 5 oz glass of wine (148 mL), or one 1 oz glass of hard liquor (44 mL). General instructions  Schedule regular health, dental, and eye exams.  Stay current with your vaccines.  Tell your health care provider if: ? You often feel depressed. ? You have ever been abused or do not feel safe at home. Summary  Adopting a healthy lifestyle and getting preventive care are important in promoting health and wellness.  Follow your health care provider's instructions about healthy  diet, exercising, and getting tested or screened for diseases.  Follow your health care provider's instructions on monitoring your cholesterol and blood pressure. This information is not intended to replace advice given to you by your health care provider. Make sure you discuss any questions you have with your health care provider. Document Revised: 11/08/2018 Document Reviewed: 11/08/2018 Elsevier Patient Education  2020 [employer].  

## 2020-03-12 NOTE — Progress Notes (Signed)
This visit occurred during the SARS-CoV-2 public health emergency.  Safety protocols were in place, including screening questions prior to the visit, additional usage of staff PPE, and extensive cleaning of exam room while observing appropriate contact time as indicated for disinfecting solutions.    Patient ID: Holly Medina, female  DOB: February 19, 1981, 39 y.o.   MRN: [id number] Patient Care Team    Relationship Specialty Notifications Start End  Ma Hillock, DO PCP - General Family Medicine  11/06/15   Brien Few, MD Consulting Physician Obstetrics and Gynecology  05/03/17     Chief Complaint  Patient presents with  . Annual Exam    Fasting. Pap smear 01/2020. Mammogram 01/2020. Both normal per patient. Will request records.     Subjective:  Holly Medina is a 39 y.o.  Female  present for CPE. All past medical history, surgical history, allergies, family history, immunizations, medications and social history were updated in the electronic medical record today. All recent labs, ED visits and hospitalizations within the last year were reviewed.  Depression with anxiety:  She reports she has been stable. Zoloft 200 mg by [name].  Prior note: Patient has been stable with her depression and anxiety on Zoloft 200 mg daily. She has had a change in her psychiatry provider secondary to her prior psychiatrist changing positions. She has had one appointment with her new provider [name], FNP. She so far feels like this is a good fit for her and has routine follow-up schedule. Zoloft medications are provided through her psychiatrist.  Health maintenance:  Colonoscopy: No fhx, screen 44 Mammogram: completed 01/2020. At GYN., normal. Dr. Ronita Hipps.  Cervical cancer screening: 2021  Dr. Ronita Hipps. IUD 2021 Mirena Immunizations: tdap 01/21/2014 UTD, Flu shot  Encouraged yearly.  Infectious disease screening: HIV screen completed 2014. J&J covid vaccine 02/2020  completed. Patient has a dental home. No Hospital or ED admissions Assistive device: none Oxygen EUM:PNTI Patient has a Dental home. Hospitalizations/ED visits: reviewed  Depression screen [facility] 2/9 03/12/2020 05/03/2017 11/06/2015  Decreased Interest 0 0 0  Down, Depressed, Hopeless 0 0 0  PHQ - 2 Score 0 0 0   No flowsheet data found.   Immunization History  Administered Date(s) Administered  . Influenza,inj,Quad PF,6+ Mos 11/06/2015, 08/23/2018  . Janssen (J&J) SARS-COV-2 Vaccination 03/05/2020  . Td 01/21/2014  . Tdap 10/20/2011    Past Medical History:  Diagnosis Date  . Abnormal Pap smear of cervix 10/2006   HGSIL (dr. Alford [facility])  . Allergy    seasonal  . Anxiety   . Asthma   . Blood type B+   . Depression   . Hemorrhoid   . History of chicken pox   . Postpartum care following vaginal delivery (11/20) 10/20/2011   No Known Allergies Past Surgical History:  Procedure Laterality Date  . COLPOSCOPY  2009   benign   Family History  Problem Relation Age of Onset  . Asthma Mother   . Hypertension Father   . Multiple sclerosis Maternal Grandmother   . Cancer Maternal Grandfather        lung  . Cancer Paternal Uncle        lung cancer/smoker  . COPD Paternal Grandfather   . Colon cancer Neg Hx   . Breast cancer Neg Hx    Social History   Social History Narrative   Married. Husband's name is Holly Medina. Has 3 children, [name] of science degree. 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   Drink caffeinated beverages.   Wears her seatbelt, exercises at least 3 times a week.   Smoke detector in the home. Firearms in the home, and a locked cabinet.   Patient feels safe at her relationships.    Allergies as of 03/12/2020   No Known Allergies     Medication List       Accurate as of March 12, 2020  9:42 AM. If you have any questions, ask your nurse or doctor.        STOP taking these  medications   cyclobenzaprine 10 MG tablet Commonly known as: FLEXERIL Stopped by: Felix Pacini, DO   traMADol 50 MG tablet Commonly known as: ULTRAM Stopped by: Felix Pacini, DO     TAKE these medications   levonorgestrel 20 MCG/24HR IUD Commonly known as: MIRENA 1 each by Intrauterine route once.   sertraline 100 MG tablet Commonly known as: ZOLOFT Take 200 mg by mouth daily.       All past medical history, surgical history, allergies, family history, immunizations andmedications were updated in the EMR today and reviewed under the history and medication portions of their EMR.     No results found for this or any previous visit (from the past 2160 hour(s)).   ROS: 14 pt review of systems performed and negative (unless mentioned in an HPI)  Objective: BP 101/70 (BP Location: Left Arm, Patient Position: Sitting, Cuff Size: Normal)   Pulse 62   Temp 98.2 F (36.8 C) (Temporal)   Resp 16   Ht 5\' 4"  (1.626 m)   Wt 150 lb 2 oz (68.1 kg)   SpO2 100%   Breastfeeding No   BMI 25.77 kg/m  Gen: Afebrile. No acute distress. Nontoxic in appearance, well-developed, well-nourished,  Pleasant female.  HENT: AT. [place]. Bilateral TM visualized and normal in appearance, normal external auditory canal. MMM, no oral lesions, adequate dentition. Bilateral nares within normal limits. Throat without erythema, ulcerations or exudates. no Cough on exam, no hoarseness on exam. Eyes:Pupils Equal Round Reactive to light, Extraocular movements intact,  Conjunctiva without redness, discharge or icterus. Neck/lymp/endocrine: Supple,no lymphadenopathy, no thyromegaly CV: RRR no murmur, no edema, +2/4 P posterior tibialis pulses.  Chest: CTAB, no wheeze, rhonchi or crackles. normal Respiratory effort. good Air movement. Abd: Soft. flat. NTND. BS present. no Masses palpated. No hepatosplenomegaly. No rebound tenderness or guarding. Skin: no rashes, purpura or petechiae. Warm and well-perfused. Skin  intact. Neuro/Msk:  Normal gait. PERLA. EOMi. Alert. Oriented x3.  Cranial nerves II through XII intact. Muscle strength 5/5 upper/lower extremity. DTRs equal bilaterally. Psych: Normal affect, dress and demeanor. Normal speech. Normal thought content and judgment.   No exam data present  Assessment/plan: SATOYA FEELEY is a 39 y.o. female present for CPE Overweight (BMI 25.0-29.9) - Lipid panel - continue diet and exercise. Pt appears physically fit.  Screening for deficiency anemia - CBC Diabetes mellitus screening - Comprehensive metabolic panel - Hemoglobin A1c Depression with anxiety/long term med use Stable. Managed by 10-01-2001, NP- long term use of zoloft.  - TSH - CMP Encounter for preventive health examination Patient was encouraged to exercise greater than 150 minutes a week. Patient was encouraged to choose a diet filled with fresh fruits and vegetables, and lean meats. AVS provided to patient today for education/recommendation on gender specific health and safety maintenance. Colonoscopy: No fhx, screen 44 Mammogram: completed 01/2020. At GYN., normal. Dr. 02/2020.  Cervical cancer screening: 2021  Dr. Billy Coast. IUD 2021 Mirena Immunizations: tdap 01/21/2014 UTD, Flu shot  Encouraged yearly.  Infectious disease screening: HIV screen completed 2014. J&J covid vaccine 02/2020 completed.  Return in about 1 year (around 03/12/2021) for CPE (30 min).   Orders Placed This Encounter  Procedures  . CBC  . Comprehensive metabolic panel  . Hemoglobin A1c  . Lipid panel  . TSH   No orders of the defined types were placed in this encounter.  Referral Orders  No referral(s) requested today     Electronically signed by: Felix Pacini, DO [facility] [place]

## 2020-03-25 ENCOUNTER — Encounter: Payer: Self-pay | Admitting: Family Medicine

## 2020-03-26 ENCOUNTER — Encounter: Payer: Self-pay | Admitting: Family Medicine

## 2020-07-21 ENCOUNTER — Ambulatory Visit (INDEPENDENT_AMBULATORY_CARE_PROVIDER_SITE_OTHER): Payer: Commercial Managed Care - PPO | Admitting: Family Medicine

## 2020-07-21 ENCOUNTER — Other Ambulatory Visit: Payer: Self-pay

## 2020-07-21 ENCOUNTER — Ambulatory Visit (HOSPITAL_BASED_OUTPATIENT_CLINIC_OR_DEPARTMENT_OTHER)
Admission: RE | Admit: 2020-07-21 | Discharge: 2020-07-21 | Disposition: A | Discharge status: Home / Self Care | Payer: Commercial Managed Care - PPO | Source: Ambulatory Visit | Attending: Family Medicine | Admitting: Family Medicine

## 2020-07-21 ENCOUNTER — Encounter: Payer: Self-pay | Admitting: Family Medicine

## 2020-07-21 VITALS — BP 118/80 | HR 55 | Temp 98.1°F | Resp 16 | Wt 157.4 lb

## 2020-07-21 DIAGNOSIS — M546 Pain in thoracic spine: Secondary | ICD-10-CM

## 2020-07-21 NOTE — Progress Notes (Signed)
This visit occurred during the SARS-CoV-2 public health emergency.  Safety protocols were in place, including screening questions prior to the visit, additional usage of staff PPE, and extensive cleaning of exam room while observing appropriate contact time as indicated for disinfecting solutions.    Holly Medina , 1981-07-15, 39 y.o., female MRN: [id number] Patient Care Team    Relationship Specialty Notifications Start End  Natalia Leatherwood, DO PCP - General Family Medicine  11/06/15   Olivia Mackie, MD Consulting Physician Obstetrics and Gynecology  05/03/17     Chief Complaint  Patient presents with  . Back Pain    Pain behind shoulder blade and rib     Subjective: Pt presents for an OV with complaints of thoracic back pain of 4 weeks duration.  Associated symptoms include pain started 4 weeks ago. Over the last 2 weeks it started behind her shoulder blade. She does recall being at the beach about 4 weeks ago and playing volleyball, which she has not done in a long time. She is tearful today and is concerned about "worst case scenario" because she googled it and her aunt died of lung cancer in her 102s (was a heavy smoker). Pt was a smoker (socially) in college only. She denies fever, chills, shortness of breath, cough or fatigue. Pain is more notable with movement and when she sits in certain positions.  Mammogram is UTD 01/2020 and normal.   No LMP recorded. (Menstrual status: IUD).   Depression screen [facility] 2/9 07/21/2020 03/12/2020 05/03/2017 11/06/2015  Decreased Interest 0 0 0 0  Down, Depressed, Hopeless 0 0 0 0  PHQ - 2 Score 0 0 0 0  Altered sleeping 0 - - -  Tired, decreased energy 0 - - -  Change in appetite 0 - - -  Feeling bad or failure about yourself  0 - - -  Trouble concentrating 0 - - -  Moving slowly or fidgety/restless 0 - - -  Suicidal thoughts 0 - - -  PHQ-9 Score 0 - - -    No Known Allergies Social History   Social History Narrative   Married.  Husband's name is Jill Alexanders. Has 3 children, Zenovia Jordan of science degree. 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   Drink caffeinated beverages.   Wears her seatbelt, exercises at least 3 times a week.   Smoke detector in the home. Firearms in the home, and a locked cabinet.   Patient feels safe at her relationships.   Past Medical History:  Diagnosis Date  . Abnormal Pap smear of cervix 10/2006   HGSIL (dr. Merilynn [facility])  . Allergy    seasonal  . Anxiety   . Asthma   . Blood type B+   . Depression   . Hemorrhoid   . History of chicken pox   . Postpartum care following vaginal delivery (11/20) 10/20/2011   Past Surgical History:  Procedure Laterality Date  . COLPOSCOPY  2009   benign   Family History  Problem Relation Age of Onset  . Asthma Mother   . Hypertension Father   . Multiple sclerosis Maternal Grandmother   . Cancer Maternal Grandfather        lung  . Cancer Paternal Uncle        lung cancer/smoker  . COPD Paternal Grandfather   . Colon cancer Neg Hx   . Breast cancer Neg Hx    Allergies  as of 07/21/2020   No Known Allergies     Medication List       Accurate as of July 21, 2020 10:28 AM. If you have any questions, ask your nurse or doctor.        levonorgestrel 20 MCG/24HR IUD Commonly known as: MIRENA 1 each by Intrauterine route once.   sertraline 100 MG tablet Commonly known as: ZOLOFT Take 200 mg by mouth daily.       All past medical history, surgical history, allergies, family history, immunizations andmedications were updated in the EMR today and reviewed under the history and medication portions of their EMR.     ROS: Negative, with the exception of above mentioned in HPI   Objective:  BP 118/80 (BP Location: Left Arm, Patient Position: Sitting, Cuff Size: Normal)   Pulse (!) 55   Temp 98.1 F (36.7 C) (Oral)   Resp 16   Wt 157 lb 6.4 oz (71.4 kg)   SpO2 100%   BMI  27.02 kg/m  Body mass index is 27.02 kg/m. Gen: Afebrile. No acute distress. Nontoxic in appearance, well developed, well nourished.  HENT: AT. [place].  Eyes:Pupils Equal Round Reactive to light, Extraocular movements intact,  Conjunctiva without redness, discharge or icterus. Neck/lymp/endocrine: Supple,no lymphadenopathy CV: RRR, no edema Chest: CTAB, no wheeze or crackles. Good air movement, normal resp effort.  MSK: No TTP thoracic spine or ribs. Normal  ABDuction of bilateral arms with symmetric scapula movements.  Neuro:  Normal gait. PERLA. EOMi. Alert. Oriented x3  Psych: tearful, Normal affect, dress and demeanor. Normal speech. Normal thought content and judgment.  No exam data present No results found. No results found for this or any previous visit (from the past 24 hour(s)).  Assessment/Plan: Holly Medina is a 39 y.o. female present for OV for  Acute left-sided thoracic back pain Most likely thoracic strain/rhomboid muscle group by HPI and exam.  Will obtain xray of left thoracic area to r/o stress fx/rib fracture.  Thoracic strain and rehab instructions provided to pt - DG Thoracic Spine W/Swimmers; Future - nsaids for reflief. Heat application can be helpful F/u PRN   Reviewed expectations re: course of current medical issues.  Discussed self-management of symptoms.  Outlined signs and symptoms indicating need for more acute intervention.  Patient verbalized understanding and all questions were answered.  Patient received an After-Visit Summary.    Orders Placed This Encounter  Procedures  . DG Thoracic Spine W/Swimmers   No orders of the defined types were placed in this encounter.  Referral Orders  No referral(s) requested today     Note is dictated utilizing voice recognition software. Although note has been proof read prior to signing, occasional typographical errors still can be missed. If any questions arise, please do not hesitate to call  for verification.   electronically signed by:  Felix Pacini, DO  [facility] [place]

## 2020-07-21 NOTE — Patient Instructions (Signed)
Thoracic Strain A thoracic strain is an injury to the muscles or tendons that attach to the upper back. Tendons are tissues that connect muscle to bone. This injury is sometimes called a mid-back strain. A strain can be mild or very bad. A mild strain may take only 1-2 weeks to heal. A very bad strain involves torn muscles or tendons, so it may take 6-8 weeks to heal. What are the causes? This condition may be caused by:  A fall or a hit to the body.  Twisting or stretching the back too far. This may happen when doing activities that require a lot of energy, such as lifting heavy objects. In some cases, the cause may not be known. What increases the risk? This injury is more common in:  Athletes.  People who are very overweight (obese). What are the signs or symptoms?  Pain in the middle back, especially with movement. This is the main symptom.  Stiffness or limited range of motion.  Sudden muscle tightening (spasms). How is this treated? This condition may be treated with:  Resting the injured area.  Putting heat and cold on the injured area.  Medicines for pain and inflammation, such as NSAIDs.  Prescription medicine for pain or to relax the muscles. These may be used for a short time if needed.  Physical therapy. This will involve doing exercises to stretch and strengthen the middle back. Follow these instructions at home: Managing pain, stiffness, and swelling      If told, put ice on the injured area. ? Put ice in a plastic bag. ? Place a towel between your skin and the bag. ? Leave the ice on for 20 minutes, 2-3 times a day.  If told, put heat on the affected area. Do this as often as told by your doctor. Use the heat source that your doctor recommends, such as a moist heat pack or a heating pad. ? Place a towel between your skin and the heat source. ? Leave the heat on for 20-30 minutes. ? Remove the heat if your skin turns bright red. This is very important if  you are unable to feel pain, heat, or cold. You may have a greater risk of getting burned. Activity  Rest and return to your normal activities as told by your doctor. Ask your doctor what activities are safe for you.  Do exercises as told by your doctor. Medicines  Take over-the-counter and prescription medicines only as told by your doctor.  Ask your doctor if the medicine prescribed to you: ? Requires you to avoid driving or using heavy machinery. ? Can cause trouble pooping (constipation). You may need to take steps to prevent or treat trouble pooping:  Drink enough fluid to keep your pee (urine) pale yellow.  Take over-the-counter or prescription medicines.  Eat foods that are high in fiber. These include beans, whole grains, and fresh fruits and vegetables.  Limit foods that are high in fat and processed sugars. These include fried or sweet foods. Injury prevention To prevent a future mid-back injury:  Always warm up before physical activity or sports.  Cool down and stretch after being active.  Use correct form when playing sports and lifting heavy objects. Bend your knees before you lift heavy objects.  Use good posture when sitting and standing.  Stay physically fit and maintain a healthy weight. ? Do at least 150 minutes of moderate-intensity exercise each week, such as brisk walking or water aerobics. ? Do strength exercises   at least 2 times each week.  General instructions  Do not use any products that contain nicotine or tobacco. These products include cigarettes, e-cigarettes, and chewing tobacco. If you need help quitting, ask your doctor.  Keep all follow-up visits as told by your doctor. This is important. Contact a doctor if:  Your pain is not helped by medicine.  Your pain or stiffness is getting worse.  You have pain or stiffness in your neck or lower back. Get help right away if you:  Have shortness of breath.  Have chest pain.  Have weakness  or loss of feeling (numbness) in your legs or arms.  Cannot control when you pee (urinate). Summary  A thoracic strain is an injury to the muscles or tendons that attach to the upper back.  If told, put ice or heat on the affected area.  Rest and return to your normal activities as told by your doctor.  Keep all follow-up visits as told by your doctor. This information is not intended to replace advice given to you by your health care provider. Make sure you discuss any questions you have with your health care provider. Document Revised: 10/03/2018 Document Reviewed: 10/03/2018 Elsevier Patient Education  2020 [employer].   Thoracic Strain Rehab Ask your health care provider which exercises are safe for you. Do exercises exactly as told by your health care provider and adjust them as directed. It is normal to feel mild stretching, pulling, tightness, or discomfort as you do these exercises. Stop right away if you feel sudden pain or your pain gets worse. Do not begin these exercises until told by your health care provider. Stretching and range-of-motion exercise This exercise warms up your muscles and joints and improves the movement and flexibility of your back and shoulders. This exercise also helps to relieve pain. Chest and spine stretch  1. Lie down on your back on a firm surface. 2. Roll a towel or a small blanket so it is about 4 inches (10 cm) in diameter. 3. Put the towel lengthwise under the middle of your back so it is under your spine, but not under your shoulder blades. 4. Put your hands behind your head and let your elbows fall to your sides. This will increase your stretch. 5. Take a deep breath (inhale). 6. Hold for __________ seconds. 7. Relax after you breathe out (exhale). Repeat __________ times. Complete this exercise __________ times a day. Strengthening exercises These exercises build strength and endurance in your back and your shoulder blade muscles.  Endurance is the ability to use your muscles for a long time, even after they get tired. Alternating arm and leg raises  1. Get on your hands and knees on a firm surface. If you are on a hard floor, you may want to use padding, such as an exercise mat, to cushion your knees. 2. Line up your arms and legs. Your hands should be directly below your shoulders, and your knees should be directly below your hips. 3. Lift your left leg behind you. At the same time, raise your right arm and straighten it in front of you. ? Do not lift your leg higher than your hip. ? Do not lift your arm higher than your shoulder. ? Keep your abdominal and back muscles tight. ? Keep your hips facing the ground. ? Do not arch your back. ? Keep your balance carefully, and do not hold your breath. 4. Hold for __________ seconds. 5. Slowly return to the starting  position and repeat with your right leg and your left arm. Repeat __________ times. Complete this exercise __________ times a day. Straight arm rows This exercise is also called shoulder extension exercise. 1. Stand with your feet shoulder width apart. 2. Secure an exercise band to a stable object in front of you so the band is at or above shoulder height. 3. Hold one end of the exercise band in each hand. 4. Straighten your elbows and lift your hands up to shoulder height. 5. Step back, away from the secured end of the exercise band, until the band stretches. 6. Squeeze your shoulder blades together and pull your hands down to the sides of your thighs. Stop when your hands are straight down by your sides. This is shoulder extension. Do not let your hands go behind your body. 7. Hold for __________ seconds. 8. Slowly return to the starting position. Repeat __________ times. Complete this exercise __________ times a day. Prone shoulder external rotation 1. Lie on your abdomen on a firm bed so your left / right forearm hangs over the edge of the bed and your  upper arm is on the bed, straight out from your body. This is the prone position. ? Your elbow should be bent. ? Your palm should be facing your feet. 2. If instructed, hold a __________ weight in your hand. 3. Squeeze your shoulder blade toward the middle of your back. Do not let your shoulder lift toward your ear. 4. Keep your elbow bent in a 90-degree angle (right angle) while you slowly move your forearm up toward the ceiling. Move your forearm up to the height of the bed, toward your head. This is external rotation. ? Your upper arm should not move. ? At the top of the movement, your palm should face the floor. 5. Hold for __________ seconds. 6. Slowly return to the starting position and relax your muscles. Repeat __________ times. Complete this exercise __________ times a day. Rowing scapular retraction This is an exercise in which the shoulder blades (scapulae) are pulled toward each other (retraction). 1. Sit in a stable chair without armrests, or stand up. 2. Secure an exercise band to a stable object in front of you so the band is at shoulder height. 3. Hold one end of the exercise band in each hand. Your palms should face down. 4. Bring your arms out straight in front of you. 5. Step back, away from the secured end of the exercise band, until the band stretches. 6. Pull the band backward. As you do this, bend your elbows and squeeze your shoulder blades together, but avoid letting the rest of your body move. Do not shrug your shoulders upward while you do this. 7. Stop when your elbows are at your sides or slightly behind your body. 8. Hold for __________ seconds. 9. Slowly straighten your arms to return to the starting position. Repeat __________ times. Complete this exercise __________ times a day. Posture and body mechanics Good posture and healthy body mechanics can help to relieve stress in your body's tissues and joints. Body mechanics refers to the movements and positions of  your body while you do your daily activities. Posture is part of body mechanics. Good posture means:  Your spine is in its natural S-curve position (neutral).  Your shoulders are pulled back slightly.  Your head is not tipped forward. Follow these guidelines to improve your posture and body mechanics in your everyday activities. Standing   When standing, keep your spine neutral  and your feet about hip width apart. Keep a slight bend in your knees. Your ears, shoulders, and hips should line up with each other.  When you do a task in which you lean forward while standing in one place for a long time, place one foot up on a stable object that is 2-4 inches (5-10 cm) high, such as a footstool. This helps keep your spine neutral. Sitting   When sitting, keep your spine neutral and keep your feet flat on the floor. Use a footrest, if necessary, and keep your thighs parallel to the floor. Avoid rounding your shoulders, and avoid tilting your head forward.  When working at a desk or a computer, keep your desk at a height where your hands are slightly lower than your elbows. Slide your chair under your desk so you are close enough to maintain good posture.  When working at a computer, place your monitor at a height where you are looking straight ahead and you do not have to tilt your head forward or downward to look at the screen. Resting When lying down and resting, avoid positions that are most painful for you.  If you have pain with activities such as sitting, bending, stooping, or squatting (flexion-basedactivities), lie in a position in which your body does not bend very much. For example, avoid curling up on your side with your arms and knees near your chest (fetal position).  If you have pain with activities such as standing for a long time or reaching with your arms (extension-basedactivities), lie with your spine in a neutral position and bend your knees slightly. Try the following  positions: ? Lie on your side with a pillow between your knees. ? Lie on your back with a pillow under your knees.  Lifting   When lifting objects, keep your feet at least shoulder width apart and tighten your abdominal muscles.  Bend your knees and hips and keep your spine neutral. It is important to lift using the strength of your legs, not your back. Do not lock your knees straight out.  Always ask for help to lift heavy or awkward objects. This information is not intended to replace advice given to you by your health care provider. Make sure you discuss any questions you have with your health care provider. Document Revised: 03/09/2019 Document Reviewed: 12/25/2018 Elsevier Patient Education  2020 [employer].

## 2020-07-29 ENCOUNTER — Ambulatory Visit: Payer: Commercial Managed Care - PPO | Admitting: Family Medicine

## 2020-11-11 ENCOUNTER — Ambulatory Visit: Payer: Commercial Managed Care - PPO | Admitting: Family Medicine

## 2020-11-12 ENCOUNTER — Encounter: Payer: Self-pay | Admitting: Family Medicine

## 2020-11-12 ENCOUNTER — Other Ambulatory Visit: Payer: Self-pay

## 2020-11-12 ENCOUNTER — Ambulatory Visit (INDEPENDENT_AMBULATORY_CARE_PROVIDER_SITE_OTHER): Payer: Commercial Managed Care - PPO | Admitting: Family Medicine

## 2020-11-12 VITALS — BP 107/70 | HR 81 | Temp 98.0°F | Ht 64.0 in | Wt 156.0 lb

## 2020-11-12 DIAGNOSIS — M546 Pain in thoracic spine: Secondary | ICD-10-CM | POA: Diagnosis not present

## 2020-11-12 DIAGNOSIS — Z23 Encounter for immunization: Secondary | ICD-10-CM

## 2020-11-12 DIAGNOSIS — M4184 Other forms of scoliosis, thoracic region: Secondary | ICD-10-CM | POA: Diagnosis not present

## 2020-11-12 DIAGNOSIS — G8929 Other chronic pain: Secondary | ICD-10-CM | POA: Diagnosis not present

## 2020-11-12 NOTE — Progress Notes (Signed)
This visit occurred during the SARS-CoV-2 public health emergency.  Safety protocols were in place, including screening questions prior to the visit, additional usage of staff PPE, and extensive cleaning of exam room while observing appropriate contact time as indicated for disinfecting solutions.    Holly Medina , May 13, 1981, 39 y.o., female MRN: [id number] Patient Care Team    Relationship Specialty Notifications Start End  Holly Leatherwood, DO PCP - General Family Medicine  11/06/15   Holly Mackie, MD Consulting Physician Obstetrics and Gynecology  05/03/17     Chief Complaint  Patient presents with  . Back Pain    Pt c/o left sided back pain that worsen with movement such as twisting; pt states that stretches and heat relieves pain x 4 mos     Subjective: Pt presents for an OV with complaints of thoracic back pain. Pt was seen 8/23 with back pain  of 4 weeks duration at that time.  In which she thought she may have strained herself at the beach when playing volleyball.  X-ray was obtained and was without any acute processes.  She does have a mild curvature to her thoracic spine naturally.  She reports she has been using heat and NSAIDs off and on since that time and she continues to have recurrences of her discomfort.  Pain is mostly located near her left lower thoracic area underneath scapula.  She is physically fit and exercises routinely.  She states that with muscle skeletal movements or a very deep breath is when she feels the discomfort. Prior note:  Associated symptoms include pain started 4 weeks ago. Over the last 2 weeks it started behind her shoulder blade. She does recall being at the beach about 4 weeks ago and playing volleyball, which she has not done in a long time. She is tearful today and is concerned about "worst case scenario" because she googled it and her aunt died of lung cancer in her 16s (was a heavy smoker). Pt was a smoker (socially) in college only.  She denies fever, chills, shortness of breath, cough or fatigue. Pain is more notable with movement and when she sits in certain positions.  Mammogram is UTD 01/2020 and normal.    No LMP recorded. (Menstrual status: IUD).   Depression screen [facility] 2/9 11/12/2020 07/21/2020 03/12/2020 05/03/2017 11/06/2015  Decreased Interest 0 0 0 0 0  Down, Depressed, Hopeless 0 0 0 0 0  PHQ - 2 Score 0 0 0 0 0  Altered sleeping - 0 - - -  Tired, decreased energy - 0 - - -  Change in appetite - 0 - - -  Feeling bad or failure about yourself  - 0 - - -  Trouble concentrating - 0 - - -  Moving slowly or fidgety/restless - 0 - - -  Suicidal thoughts - 0 - - -  PHQ-9 Score - 0 - - -    No Known Allergies Social History   Social History Narrative   Married. Husband's name is Jill Alexanders. Has 3 children, Zenovia Jordan of science degree. 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   Drink caffeinated beverages.   Wears her seatbelt, exercises at least 3 times a week.   Smoke detector in the home. Firearms in the home, and a locked cabinet.   Patient feels safe at her relationships.   Past Medical History:  Diagnosis Date  . Abnormal Pap smear of  cervix 10/2006   [facility] (dr. Merilynn [facility])  . Allergy    seasonal  . Anxiety   . Asthma   . Blood type B+   . Depression   . Hemorrhoid   . History of chicken pox   . Postpartum care following vaginal delivery (11/20) 10/20/2011   Past Surgical History:  Procedure Laterality Date  . COLPOSCOPY  2009   benign   Family History  Problem Relation Age of Onset  . Asthma Mother   . Hypertension Father   . Multiple sclerosis Maternal Grandmother   . Cancer Maternal Grandfather        lung  . Cancer Paternal Uncle        lung cancer/smoker  . COPD Paternal Grandfather   . Colon cancer Neg Hx   . Breast cancer Neg Hx    Allergies as of 11/12/2020   No Known Allergies     Medication List        Accurate as of November 12, 2020  2:03 PM. If you have any questions, ask your nurse or doctor.        hydrOXYzine 25 MG capsule Commonly known as: VISTARIL Take 25 mg by mouth daily as needed.   levonorgestrel 20 MCG/24HR IUD Commonly known as: MIRENA 1 each by Intrauterine route once.   sertraline 100 MG tablet Commonly known as: ZOLOFT Take 200 mg by mouth daily.       All past medical history, surgical history, allergies, family history, immunizations andmedications were updated in the EMR today and reviewed under the history and medication portions of their EMR.     ROS: Negative, with the exception of above mentioned in HPI   Objective:  BP 107/70   Pulse 81   Temp 98 F (36.7 C) (Oral)   Ht 5\' 4"  (1.626 m)   Wt 156 lb (70.8 kg)   SpO2 98%   BMI 26.78 kg/m  Body mass index is 26.78 kg/m. Gen: Afebrile. No acute distress.  HENT: AT. [place].  Eyes:Pupils Equal Round Reactive to light, Extraocular movements intact,  Conjunctiva without redness, discharge or icterus. MSK: No erythema, no soft tissue swelling.  No masses.  No tenderness to palpation.  Normal exhalation and inhalation/rib movement.mild drag left scapula compared to right with abduction of arms.  Neurovascularly intact distally. Skin: no rashes, purpura or petechiae.  Neuro: Normal gait. PERLA. EOMi. Alert. Oriented x3  Psych: Normal affect, dress and demeanor. Normal speech. Normal thought content and judgment.   No exam data present No results found. No results found for this or any previous visit (from the past 24 hour(s)).  Assessment/Plan: Holly Medina is a 39 y.o. female present for OV for  chronic left-sided thoracic back pain Xray with mild curvature thoracic spine- otherwise no acute changes. I believe she would benefit from OMT.  Referral to [facility] for OMT eval and treat.  - Ambulatory referral to Sports Medicine  Need for influenza vaccination administered  today   Reviewed expectations re: course of current medical issues.  Discussed self-management of symptoms.  Outlined signs and symptoms indicating need for more acute intervention.  Patient verbalized understanding and all questions were answered.  Patient received an After-Visit Summary.    Orders Placed This Encounter  Procedures  . Flu Vaccine QUAD 6+ mos PF IM (Fluarix Quad PF)  . Ambulatory referral to Sports Medicine   No orders of the defined types were placed in this encounter.   Referral Orders  Ambulatory referral to Sports Medicine   Note is dictated utilizing voice recognition software. Although note has been proof read prior to signing, occasional typographical errors still can be missed. If any questions arise, please do not hesitate to call for verification.   electronically signed by:  Felix Pacini, DO  [facility] [place]

## 2020-11-12 NOTE — Patient Instructions (Addendum)
    [facility] He specializes in sports medicine.  This seems consistent with muscle cause of discomfort.

## 2020-12-04 NOTE — Progress Notes (Unsigned)
[facility] [address] [place] [address] Phone: [phone] Subjective:   Holly Medina, am serving as a scribe for Dr. Antoine Primas. This visit occurred during the SARS-CoV-2 public health emergency.  Safety protocols were in place, including screening questions prior to the visit, additional usage of staff PPE, and extensive cleaning of exam room while observing appropriate contact time as indicated for disinfecting solutions.   I'm seeing this patient by the request  of:  Kuneff, Renee A, DO  CC: Thoracic pain  RCV:[id number]  Holly Medina is a 40 y.o. female coming in with complaint of thoracic spine pain between left scapula into left side of rib cage since August. Patient [place]b [place] the beach and feels she may have injured that side. Pain intensity varies with diff movements. Has used heat and stretches. Denies any radiating symptoms.  Patient does not take any anti-inflammatories regularly.  When she does take ibuprofen it does help.  Patient denies any association with food, denies any nighttime pain, denies any shortness of breath.   xray taken 07/21/2020- Mild dextrocurvature in thoracic but otherwise unremarkable   Past Medical History:  Diagnosis Date  . Abnormal Pap smear of cervix 10/2006   HGSIL (dr. Merilynn [facility])  . Allergy    seasonal  . Anxiety   . Asthma   . Blood type B+   . Depression   . Hemorrhoid   . History of chicken pox   . Postpartum care following vaginal delivery (11/20) 10/20/2011   Past Surgical History:  Procedure Laterality Date  . COLPOSCOPY  2009   benign   Social History   Socioeconomic History  . Marital status: Married    Spouse name: Not on file  . Number of children: Not on file  . Years of education: Not on file  . Highest education level: Not on file  Occupational History  . Not on file  Tobacco Use  . Smoking status: Former Smoker    Types: Cigarettes    Quit date:  11/29/2002    Years since quitting: 18.0  . Smokeless tobacco: Never Used  Vaping Use  . Vaping Use: Never used  Substance and Sexual Activity  . Alcohol use: Yes    Alcohol/week: 1.0 standard drink    Types: 1 Standard drinks or equivalent per week  . Drug use: No  . Sexual activity: Yes  Other Topics Concern  . Not on file  Social History Narrative   Married. Husband's name is Jill Alexanders. Has 3 children, Zenovia Jordan of science degree. 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   Drink caffeinated beverages.   Wears her seatbelt, exercises at least 3 times a week.   Smoke detector in the home. Firearms in the home, and a locked cabinet.   Patient feels safe at her relationships.   Social Determinants of Health   Financial Resource Strain: Not on file  Food Insecurity: Not on file  Transportation Needs: Not on file  Physical Activity: Not on file  Stress: Not on file  Social Connections: Not on file   No Known Allergies Family History  Problem Relation Age of Onset  . Asthma Mother   . Hypertension Father   . Multiple sclerosis Maternal Grandmother   . Cancer Maternal Grandfather        lung  . Cancer Paternal Uncle        lung cancer/smoker  .  COPD Paternal Grandfather   . Colon cancer Neg Hx   . Breast cancer Neg Hx     Current Outpatient Medications (Endocrine & Metabolic):  .  levonorgestrel (MIRENA) 20 MCG/24HR IUD, 1 each by Intrauterine route once.      Current Outpatient Medications (Other):  .  hydrOXYzine (VISTARIL) 25 MG capsule, Take 25 mg by mouth daily as needed. .  sertraline (ZOLOFT) 100 MG tablet, Take 200 mg by mouth daily.   Reviewed prior external information including notes and imaging from  primary care provider As well as notes that were available from care everywhere and other healthcare systems.  Past medical history, social, surgical and family history all reviewed in electronic  medical record.  No pertanent information unless stated regarding to the chief complaint.   Review of Systems:  No headache, visual changes, nausea, vomiting, diarrhea, constipation, dizziness, abdominal pain, skin rash, fevers, chills, night sweats, weight loss, swollen lymph nodes, body aches, joint swelling, chest pain, shortness of breath, mood changes. POSITIVE muscle aches  Objective  Blood pressure 110/74, pulse 72, height 5\' 4"  (1.626 m), weight 157 lb (71.2 kg), SpO2 95 %.   General: No apparent distress alert and oriented x3 mood and affect normal, dressed appropriately.  HEENT: Pupils equal, extraocular movements intact  Respiratory: Patient's speak in full sentences and does not appear short of breath  Cardiovascular: No lower extremity edema, non tender, no erythema  Neuro: Cranial nerves II through XII are intact, neurovascularly intact in all extremities with 2+ DTRs and 2+ pulses.  Gait normal with good balance and coordination.  MSK: Patient is on the left side of the thoracic spine has a very mild right-sided dextro curvature noted.  Patient with left scapular dyskinesis noted.  Does have some inferior winging occurring.  Good strength of the shoulder.  Negative impingement.  Negative Spurling's of the neck.  Tightness noted in the paraspinal musculature of the left side of the neck.  Osteopathic findings  C4 flexed rotated and side bent left T4 extended rotated and side bent left inhaled third rib T10 extended rotated and side bent right L1 flexed rotated and side bent right Sacrum right on right     Impression and Recommendations:     The above documentation has been reviewed and is accurate and complete , DO

## 2020-12-05 ENCOUNTER — Ambulatory Visit (INDEPENDENT_AMBULATORY_CARE_PROVIDER_SITE_OTHER): Payer: Commercial Managed Care - PPO | Admitting: Family Medicine

## 2020-12-05 ENCOUNTER — Other Ambulatory Visit: Payer: Self-pay

## 2020-12-05 ENCOUNTER — Encounter: Payer: Self-pay | Admitting: Family Medicine

## 2020-12-05 VITALS — BP 110/74 | HR 72 | Ht 64.0 in | Wt 157.0 lb

## 2020-12-05 DIAGNOSIS — M999 Biomechanical lesion, unspecified: Secondary | ICD-10-CM | POA: Diagnosis not present

## 2020-12-05 DIAGNOSIS — M4184 Other forms of scoliosis, thoracic region: Secondary | ICD-10-CM

## 2020-12-05 DIAGNOSIS — G2589 Other specified extrapyramidal and movement disorders: Secondary | ICD-10-CM | POA: Insufficient documentation

## 2020-12-05 NOTE — Patient Instructions (Signed)
Scapular exercises Continue Vit D Consider ice See me again in 5-6 weeks

## 2020-12-05 NOTE — Assessment & Plan Note (Signed)

## 2020-12-05 NOTE — Assessment & Plan Note (Addendum)
Patient does have some scapular dyskinesis. We discussed the differential also includes the possibility of some mild reflux disease as a possibility. Patient does have a mild scoliosis of the thoracic spine and we will monitor but I think patient will respond well to conservative therapy. Home exercises given. No new medications with patient already barely using anything. We discussed lifting mechanics. Patient will follow up with me again in 5 to 6 weeks. Patient is to call if any type of radicular symptoms occur or any new symptoms happen.

## 2021-01-15 NOTE — Progress Notes (Unsigned)
[name] [address] [place] [address] Phone: [phone] Subjective:   I Holly Medina am serving as a Neurosurgeon for Dr. Antoine Primas.  This visit occurred during the SARS-CoV-2 public health emergency.  Safety protocols were in place, including screening questions prior to the visit, additional usage of staff PPE, and extensive cleaning of exam room while observing appropriate contact time as indicated for disinfecting solutions.   I'm seeing this patient by the request  of:  Kuneff, Renee A, DO  CC: Upper back pain follow-up  YSA:[id number]  Holly Medina is a 40 y.o. female coming in with complaint of back and neck pain. OMT 12/05/2020. Patient states she is getting better but not at 100% yet.   Medications patient has been prescribed: None          Reviewed prior external information including notes and imaging from previsou exam, outside providers and external EMR if available.   As well as notes that were available from care everywhere and other healthcare systems.  Past medical history, social, surgical and family history all reviewed in electronic medical record.  No pertanent information unless stated regarding to the chief complaint.   Past Medical History:  Diagnosis Date  . Abnormal Pap smear of cervix 10/2006   HGSIL (dr. Merilynn [facility])  . Allergy    seasonal  . Anxiety   . Asthma   . Blood type B+   . Depression   . Hemorrhoid   . History of chicken pox   . Postpartum care following vaginal delivery (11/20) 10/20/2011    No Known Allergies   Review of Systems:  No headache, visual changes, nausea, vomiting, diarrhea, constipation, dizziness, abdominal pain, skin rash, fevers, chills, night sweats, weight loss, swollen lymph nodes, body aches, joint swelling, chest pain, shortness of breath, mood changes. POSITIVE muscle aches  Objective  Blood pressure 108/70, pulse 66, height 5\' 4"  (1.626 m), weight 157  lb (71.2 kg), SpO2 98 %.   General: No apparent distress alert and oriented x3 mood and affect normal, dressed appropriately.  HEENT: Pupils equal, extraocular movements intact  Respiratory: Patient's speak in full sentences and does not appear short of breath  Cardiovascular: No lower extremity edema, non tender, no erythema  Neuro: Cranial nerves II through XII are intact, neurovascularly intact in all extremities with 2+ DTRs and 2+ pulses.  Gait normal with good balance and coordination.  MSK:  Non tender with full range of motion and good stability and symmetric strength and tone of shoulders, elbows, wrist, hip, knee and ankles bilaterally.  Back -very mild scoliosis noted.  Patient does still have some tightness noted.  All of it seems to be in the parascapular region. Mild increase in low back pain as well.  Tightness noted in the thoracolumbar juncture right greater than left  Osteopathic findings   C6 flexed rotated and side bent left T3 extended rotated and side bent left inhaled rib L1 flexed rotated and side bent right Sacrum right on right       Assessment and Plan:    Nonallopathic problems  Decision today to treat with OMT was based on Physical Exam  After verbal consent patient was treated with HVLA, ME, FPR techniques in cervical, rib, thoracic, lumbar, and sacral  areas  Patient tolerated the procedure well with improvement in symptoms  Patient given exercises, stretches and lifestyle modifications  See medications in patient instructions if given  Patient will follow up in  4-8 weeks      The above documentation has been reviewed and is accurate and complete Lyndal Pulley, DO       Note: This dictation was prepared with Dragon dictation along with smaller phrase technology. Any transcriptional errors that result from this process are unintentional.

## 2021-01-16 ENCOUNTER — Other Ambulatory Visit: Payer: Self-pay

## 2021-01-16 ENCOUNTER — Encounter: Payer: Self-pay | Admitting: Family Medicine

## 2021-01-16 ENCOUNTER — Ambulatory Visit (INDEPENDENT_AMBULATORY_CARE_PROVIDER_SITE_OTHER): Payer: Commercial Managed Care - PPO | Admitting: Family Medicine

## 2021-01-16 VITALS — BP 108/70 | HR 66 | Ht 64.0 in | Wt 157.0 lb

## 2021-01-16 DIAGNOSIS — M999 Biomechanical lesion, unspecified: Secondary | ICD-10-CM

## 2021-01-16 DIAGNOSIS — G2589 Other specified extrapyramidal and movement disorders: Secondary | ICD-10-CM

## 2021-01-16 NOTE — Patient Instructions (Signed)
Good to see you [place] See me again in 6 weeks

## 2021-01-16 NOTE — Assessment & Plan Note (Signed)
Patient initially made some improvement with the manipulation last time.  And started to have some increasing discomfort.  Discussed home exercises and icing regimen.  Discussed which activities to do which wants to avoid.  Discussed which activities doing which wants avoid.  Patient will follow up again 4-8 weeks

## 2021-02-27 ENCOUNTER — Ambulatory Visit: Payer: Commercial Managed Care - PPO | Admitting: Family Medicine

## 2021-03-09 LAB — HM MAMMOGRAPHY

## 2021-03-18 ENCOUNTER — Encounter: Payer: Commercial Managed Care - PPO | Admitting: Family Medicine

## 2021-04-09 ENCOUNTER — Other Ambulatory Visit: Payer: Self-pay

## 2021-04-10 ENCOUNTER — Encounter: Payer: Self-pay | Admitting: Family Medicine

## 2021-04-10 ENCOUNTER — Ambulatory Visit (INDEPENDENT_AMBULATORY_CARE_PROVIDER_SITE_OTHER): Payer: Commercial Managed Care - PPO | Admitting: Family Medicine

## 2021-04-10 VITALS — BP 109/72 | HR 70 | Temp 98.0°F | Ht 63.5 in | Wt 159.0 lb

## 2021-04-10 DIAGNOSIS — Z975 Presence of (intrauterine) contraceptive device: Secondary | ICD-10-CM | POA: Diagnosis not present

## 2021-04-10 DIAGNOSIS — Z0000 Encounter for general adult medical examination without abnormal findings: Secondary | ICD-10-CM | POA: Diagnosis not present

## 2021-04-10 DIAGNOSIS — F418 Other specified anxiety disorders: Secondary | ICD-10-CM | POA: Diagnosis not present

## 2021-04-10 DIAGNOSIS — Z131 Encounter for screening for diabetes mellitus: Secondary | ICD-10-CM

## 2021-04-10 DIAGNOSIS — E663 Overweight: Secondary | ICD-10-CM

## 2021-04-10 DIAGNOSIS — Z793 Long term (current) use of hormonal contraceptives: Secondary | ICD-10-CM | POA: Diagnosis not present

## 2021-04-10 LAB — COMPREHENSIVE METABOLIC PANEL
ALT: 23 U/L (ref 0–35)
AST: 49 U/L — ABNORMAL HIGH (ref 0–37)
Albumin: 4.3 g/dL (ref 3.5–5.2)
Alkaline Phosphatase: 46 U/L (ref 39–117)
BUN: 18 mg/dL (ref 6–23)
CO2: 26 mEq/L (ref 19–32)
Calcium: 8.9 mg/dL (ref 8.4–10.5)
Chloride: 104 mEq/L (ref 96–112)
Creatinine, Ser: 0.65 mg/dL (ref 0.40–1.20)
GFR: 109.47 mL/min (ref >60.00)
Glucose, Bld: 92 mg/dL (ref 70–99)
Potassium: 4.4 mEq/L (ref 3.5–5.1)
Sodium: 138 mEq/L (ref 135–145)
Total Bilirubin: 0.5 mg/dL (ref 0.2–1.2)
Total Protein: 6.4 g/dL (ref 6.0–8.3)

## 2021-04-10 LAB — LIPID PANEL
Cholesterol: 173 mg/dL (ref 0–200)
HDL: 51.2 mg/dL (ref >39.00)
LDL Cholesterol: 112 mg/dL — ABNORMAL HIGH (ref 0–99)
NonHDL: 122.09
Total CHOL/HDL Ratio: 3
Triglycerides: 51 mg/dL (ref 0.0–149.0)
VLDL: 10.2 mg/dL (ref 0.0–40.0)

## 2021-04-10 LAB — CBC WITH DIFFERENTIAL/PLATELET
Basophils Absolute: 0.1 10*3/uL (ref 0.0–0.1)
Basophils Relative: 1 % (ref 0.0–3.0)
Eosinophils Absolute: 0.2 10*3/uL (ref 0.0–0.7)
Eosinophils Relative: 3.9 % (ref 0.0–5.0)
HCT: 38.4 % (ref 36.0–46.0)
Hemoglobin: 13.1 g/dL (ref 12.0–15.0)
Lymphocytes Relative: 26.2 % (ref 12.0–46.0)
Lymphs Abs: 1.4 10*3/uL (ref 0.7–4.0)
MCHC: 34.2 g/dL (ref 30.0–36.0)
MCV: 92.3 fl (ref 78.0–100.0)
Monocytes Absolute: 0.4 10*3/uL (ref 0.1–1.0)
Monocytes Relative: 8.1 % (ref 3.0–12.0)
Neutro Abs: 3.3 10*3/uL (ref 1.4–7.7)
Neutrophils Relative %: 60.8 % (ref 43.0–77.0)
Platelets: 268 10*3/uL (ref 150.0–400.0)
RBC: 4.16 Mil/uL (ref 3.87–5.11)
RDW: 13.2 % (ref 11.5–15.5)
WBC: 5.4 10*3/uL (ref 4.0–10.5)

## 2021-04-10 LAB — HEMOGLOBIN A1C: Hgb A1c MFr Bld: 5.2 % (ref 4.6–6.5)

## 2021-04-10 LAB — TSH: TSH: 1.06 u[IU]/mL (ref 0.35–4.50)

## 2021-04-10 NOTE — Patient Instructions (Signed)
Great to see you today.  I have refilled the medication(s) we provide.   If labs were collected, we will inform you of lab results once received either by echart message or telephone call.   - echart message- for normal results that have been seen by the patient already.   - telephone call: abnormal results or if patient has not viewed results in their echart.    Health Maintenance, Female Adopting a healthy lifestyle and getting preventive care are important in promoting health and wellness. Ask your health care provider about:  The right schedule for you to have regular tests and exams.  Things you can do on your own to prevent diseases and keep yourself healthy. What should I know about diet, weight, and exercise? Eat a healthy diet  Eat a diet that includes plenty of vegetables, fruits, low-fat dairy products, and lean protein.  Do not eat a lot of foods that are high in solid fats, added sugars, or sodium.   Maintain a healthy weight Body mass index (BMI) is used to identify weight problems. It estimates body fat based on height and weight. Your health care provider can help determine your BMI and help you achieve or maintain a healthy weight. Get regular exercise Get regular exercise. This is one of the most important things you can do for your health. Most adults should:  Exercise for at least 150 minutes each week. The exercise should increase your heart rate and make you sweat (moderate-intensity exercise).  Do strengthening exercises at least twice a week. This is in addition to the moderate-intensity exercise.  Spend less time sitting. Even light physical activity can be beneficial. Watch cholesterol and blood lipids Have your blood tested for lipids and cholesterol at 41 years of age, then have this test every 5 years. Have your cholesterol levels checked more often if:  Your lipid or cholesterol levels are high.  You are older than 40 years of age.  You are at  high risk for heart disease. What should I know about cancer screening? Depending on your health history and family history, you may need to have cancer screening at various ages. This may include screening for:  Breast cancer.  Cervical cancer.  Colorectal cancer.  Skin cancer.  Lung cancer. What should I know about heart disease, diabetes, and high blood pressure? Blood pressure and heart disease  High blood pressure causes heart disease and increases the risk of stroke. This is more likely to develop in people who have high blood pressure readings, are of African descent, or are overweight.  Have your blood pressure checked: ? Every 3-5 years if you are 18-39 years of age. ? Every year if you are 40 years old or older. Diabetes Have regular diabetes screenings. This checks your fasting blood sugar level. Have the screening done:  Once every three years after age 40 if you are at a normal weight and have a low risk for diabetes.  More often and at a younger age if you are overweight or have a high risk for diabetes. What should I know about preventing infection? Hepatitis B If you have a higher risk for hepatitis B, you should be screened for this virus. Talk with your health care provider to find out if you are at risk for hepatitis B infection. Hepatitis C Testing is recommended for:  Everyone born from 1945 through 1965.  Anyone with known risk factors for hepatitis C. Sexually transmitted infections (STIs)  Get screened for   STIs, including gonorrhea and chlamydia, if: ? You are sexually active and are younger than 41 years of age. ? You are older than 41 years of age and your health care provider tells you that you are at risk for this type of infection. ? Your sexual activity has changed since you were last screened, and you are at increased risk for chlamydia or gonorrhea. Ask your health care provider if you are at risk.  Ask your health care provider about whether  you are at high risk for HIV. Your health care provider may recommend a prescription medicine to help prevent HIV infection. If you choose to take medicine to prevent HIV, you should first get tested for HIV. You should then be tested every 3 months for as long as you are taking the medicine. Pregnancy  If you are about to stop having your period (premenopausal) and you may become pregnant, seek counseling before you get pregnant.  Take 400 to 800 micrograms (mcg) of folic acid every day if you become pregnant.  Ask for birth control (contraception) if you want to prevent pregnancy. Osteoporosis and menopause Osteoporosis is a disease in which the bones lose minerals and strength with aging. This can result in bone fractures. If you are 65 years old or older, or if you are at risk for osteoporosis and fractures, ask your health care provider if you should:  Be screened for bone loss.  Take a calcium or vitamin D supplement to lower your risk of fractures.  Be given hormone replacement therapy (HRT) to treat symptoms of menopause. Follow these instructions at home: Lifestyle  Do not use any products that contain nicotine or tobacco, such as cigarettes, e-cigarettes, and chewing tobacco. If you need help quitting, ask your health care provider.  Do not use street drugs.  Do not share needles.  Ask your health care provider for help if you need support or information about quitting drugs. Alcohol use  Do not drink alcohol if: ? Your health care provider tells you not to drink. ? You are pregnant, may be pregnant, or are planning to become pregnant.  If you drink alcohol: ? Limit how much you use to 0-1 drink a day. ? Limit intake if you are breastfeeding.  Be aware of how much alcohol is in your drink. In the [place] one drink equals one 12 oz bottle of beer (355 mL), one 5 oz glass of wine (148 mL), or one 1 oz glass of hard liquor (44 mL). General instructions  Schedule regular  health, dental, and eye exams.  Stay current with your vaccines.  Tell your health care provider if: ? You often feel depressed. ? You have ever been abused or do not feel safe at home. Summary  Adopting a healthy lifestyle and getting preventive care are important in promoting health and wellness.  Follow your health care provider's instructions about healthy diet, exercising, and getting tested or screened for diseases.  Follow your health care provider's instructions on monitoring your cholesterol and blood pressure. This information is not intended to replace advice given to you by your health care provider. Make sure you discuss any questions you have with your health care provider. Document Revised: 11/08/2018 Document Reviewed: 11/08/2018 Elsevier Patient Education  2021 [employer]  

## 2021-04-10 NOTE — Progress Notes (Signed)
This visit occurred during the SARS-CoV-2 public health emergency.  Safety protocols were in place, including screening questions prior to the visit, additional usage of staff PPE, and extensive cleaning of exam room while observing appropriate contact time as indicated for disinfecting solutions.    Patient ID: Holly Medina, female  DOB: 08-31-1981, 40 y.o.   MRN: [id number] Patient Care Team    Relationship Specialty Notifications Start End  Natalia Leatherwood, DO PCP - General Family Medicine  11/06/15   Olivia Mackie, MD Consulting Physician Obstetrics and Gynecology  05/03/17     Chief Complaint  Patient presents with  . Annual Exam    Pt is fasting     Subjective:  Holly Medina is a 40 y.o.  Female  present for CPE. All past medical history, surgical history, allergies, family history, immunizations, medications and social history were updated in the electronic medical record today. All recent labs, ED visits and hospitalizations within the last year were reviewed.  Depression with anxiety:  She reports she has been stable. Zoloft 200 mg by Deatra Robinson.  Prior note: Patient has been stable with her depression and anxiety on Zoloft 200 mg daily. She has had a change in her psychiatry provider secondary to her prior psychiatrist changing positions. She has had one appointment with her new provider Deatra Robinson, FNP. She so far feels like this is a good fit for herandhas routine follow-up schedule. Zoloft medications are provided through her psychiatrist.  Health maintenance: Colonoscopy:No fhx, screen 44 Mammogram:completed UTD 2022. At GYN.,normal. Dr. Billy Coast. Cervical cancer screening:2021  Dr. Billy Coast. IUD 2021 Mirena Immunizations:tdap 01/21/2014 UTD, Flu shot UTD 2021  Encouraged yearly.Covid X1- counseled Infectious disease screening:HIV screen completed 2014.  Hep C declined Assistive device: none Oxygen XLK:GMWN Patient has a Dental  home. Hospitalizations/ED visits: reviewed   Depression screen [facility] 2/9 04/10/2021 11/12/2020 07/21/2020 03/12/2020 05/03/2017  Decreased Interest 0 0 0 0 0  Down, Depressed, Hopeless 0 0 0 0 0  PHQ - 2 Score 0 0 0 0 0  Altered sleeping 0 - 0 - -  Tired, decreased energy 0 - 0 - -  Change in appetite 0 - 0 - -  Feeling bad or failure about yourself  0 - 0 - -  Trouble concentrating 0 - 0 - -  Moving slowly or fidgety/restless 0 - 0 - -  Suicidal thoughts 0 - 0 - -  PHQ-9 Score 0 - 0 - -   GAD 7 : Generalized Anxiety Score 04/10/2021  Nervous, Anxious, on Edge 0  Control/stop worrying 0  Worry too much - different things 0  Trouble relaxing 0  Restless 0  Easily annoyed or irritable 0  Afraid - awful might happen 0  Total GAD 7 Score 0           Immunization History  Administered Date(s) Administered  . Influenza,inj,Quad PF,6+ Mos 11/06/2015, 08/23/2018, 11/12/2020  . Janssen (J&J) SARS-COV-2 Vaccination 03/05/2020  . Td 01/21/2014  . Tdap 10/20/2011     Past Medical History:  Diagnosis Date  . Abnormal Pap smear of cervix 10/2006   HGSIL (dr. Merilynn Finland G[facility])  . Allergy    seasonal  . Anxiety   . Asthma   . Blood type B+   . Depression   . Hemorrhoid   . History of chicken pox   . Postpartum care following vaginal delivery (11/20) 10/20/2011   No Known Allergies Past Surgical History:  Procedure Laterality Date  .  COLPOSCOPY  2009   benign   Family History  Problem Relation Age of Onset  . Asthma Mother   . Hypertension Father   . Multiple sclerosis Maternal Grandmother   . Cancer Maternal Grandfather        lung  . Cancer Paternal Uncle        lung cancer/smoker  . COPD Paternal Grandfather   . Colon cancer Neg Hx   . Breast cancer Neg Hx    Social History   Social History Narrative   Married. Husband's name is Jill Alexanders. Has 3 children, Zenovia Jordan of science degree. 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   Drink caffeinated beverages.   Wears her seatbelt, exercises at least 3 times a week.   Smoke detector in the home. Firearms in the home, and a locked cabinet.   Patient feels safe at her relationships.    Allergies as of 04/10/2021   No Known Allergies     Medication List       Accurate as of Apr 10, 2021  9:07 AM. If you have any questions, ask your nurse or doctor.        hydrOXYzine 25 MG capsule Commonly known as: VISTARIL Take 25 mg by mouth daily as needed.   levonorgestrel 20 MCG/24HR IUD Commonly known as: MIRENA 1 each by Intrauterine route once.   sertraline 100 MG tablet Commonly known as: ZOLOFT Take 200 mg by mouth daily.       All past medical history, surgical history, allergies, family history, immunizations andmedications were updated in the EMR today and reviewed under the history and medication portions of their EMR.     No results found for this or any previous visit (from the past 2160 hour(s)).  DG Thoracic Spine W/Swimmers  Result Date: 07/21/2020 CLINICAL DATA:  Left rib end subscapular pain EXAM: THORACIC SPINE - 3 VIEWS COMPARISON:  None. FINDINGS: Mild dextrocurvature of the thoracolumbar spine. Sagittal alignment is within normal limits. Disc spaces are within normal limits. IMPRESSION: Negative. Electronically Signed   By: Jasmine Pang M.D.   On: 07/21/2020 19:48     ROS: 14 pt review of systems performed and negative (unless mentioned in an HPI)  Objective: BP 109/72   Pulse 70   Temp 98 F (36.7 C) (Oral)   Ht 5' 3.5" (1.613 m)   Wt 159 lb (72.1 kg)   SpO2 99%   BMI 27.72 kg/m  Gen: Afebrile. No acute distress. Nontoxic in appearance, well-developed, well-nourished,  Very pleasant female  HENT: AT. [place]. Bilateral TM visualized and normal in appearance, normal external auditory canal. MMM, no oral lesions, adequate dentition. Bilateral nares within normal limits. Throat without erythema, ulcerations or exudates.  no Cough on exam, no hoarseness on exam. Eyes:Pupils Equal Round Reactive to light, Extraocular movements intact,  Conjunctiva without redness, discharge or icterus. Neck/lymp/endocrine: Supple,no lymphadenopathy, no thyromegaly CV: RRR no murmur, noedema, +2/4 P posterior tibialis pulses.  Chest: CTAB, no wheeze, rhonchi or crackles.  normal Respiratory effort. good Air movement. Abd: Soft. flat. NTND. BS present. no Masses palpated. No hepatosplenomegaly. No rebound tenderness or guarding. Skin: no rashes, purpura or petechiae. Warm and well-perfused. Skin intact. Neuro/Msk:  Normal gait. PERLA. EOMi. Alert. Oriented x3.  Cranial nerves II through XII intact. Muscle strength 5/5 upper/lower extremity. DTRs equal bilaterally. Psych: Normal affect, dress and demeanor. Normal speech. Normal thought content and judgment.  No exam data present  Assessment/plan: MARDEE CLUNE is a 40 y.o. female present for CPE IUD (intrauterine device) in place/Overweight (BMI 25.0-29.9)/Long term current use of hormonal contraceptive - Lipid panel - CBC with Differential/Platelet - Comprehensive metabolic panel Diabetes mellitus screening - Hemoglobin A1c Depression with anxiety - TSH Routine general medical examination at a health care facility Colonoscopy:No fhx, screen 44 Mammogram:completed UTD 2022. At GYN.,normal. Dr. Billy Coast. Cervical cancer screening:2021  Dr. Billy Coast. IUD 2021 Mirena Immunizations:tdap 01/21/2014 UTD, Flu shot UTD 2021  Encouraged yearly.Covid X1- counseled Infectious disease screening:HIV screen completed 2014.  Hep C declined Patient was encouraged to exercise greater than 150 minutes a week. Patient was encouraged to choose a diet filled with fresh fruits and vegetables, and lean meats. AVS provided to patient today for education/recommendation on gender specific health and safety maintenance. Return in about 1 year (around 04/10/2022) for CPE (30 min).   Orders  Placed This Encounter  Procedures  . CBC with Differential/Platelet  . Comprehensive metabolic panel  . Hemoglobin A1c  . Lipid panel  . TSH   No orders of the defined types were placed in this encounter.  Referral Orders  No referral(s) requested today     Electronically signed by: Felix Pacini, DO [facility] [place]

## 2021-05-12 ENCOUNTER — Ambulatory Visit (INDEPENDENT_AMBULATORY_CARE_PROVIDER_SITE_OTHER): Payer: Commercial Managed Care - PPO

## 2021-05-12 ENCOUNTER — Other Ambulatory Visit: Payer: Self-pay

## 2021-05-12 DIAGNOSIS — R7989 Other specified abnormal findings of blood chemistry: Secondary | ICD-10-CM

## 2021-05-12 LAB — HEPATIC FUNCTION PANEL
ALT: 11 U/L (ref 0–35)
AST: 16 U/L (ref 0–37)
Albumin: 4.7 g/dL (ref 3.5–5.2)
Alkaline Phosphatase: 43 U/L (ref 39–117)
Bilirubin, Direct: 0.1 mg/dL (ref 0.0–0.3)
Total Bilirubin: 0.9 mg/dL (ref 0.2–1.2)
Total Protein: 7.1 g/dL (ref 6.0–8.3)

## 2021-09-23 ENCOUNTER — Ambulatory Visit (INDEPENDENT_AMBULATORY_CARE_PROVIDER_SITE_OTHER): Payer: Commercial Managed Care - PPO | Admitting: Family Medicine

## 2021-09-23 ENCOUNTER — Other Ambulatory Visit: Payer: Self-pay

## 2021-09-23 VITALS — BP 108/79 | HR 55 | Temp 97.6°F | Wt 156.0 lb

## 2021-09-23 DIAGNOSIS — Z23 Encounter for immunization: Secondary | ICD-10-CM | POA: Diagnosis not present

## 2021-09-23 DIAGNOSIS — H7202 Central perforation of tympanic membrane, left ear: Secondary | ICD-10-CM | POA: Diagnosis not present

## 2021-09-23 MED ORDER — AMOXICILLIN-POT CLAVULANATE 875-125 MG PO TABS: 1.0000 | ORAL_TABLET | Freq: Two times a day (BID) | ORAL | 0 refills | Status: DC

## 2021-09-23 MED ORDER — FLUTICASONE PROPIONATE 50 MCG/ACT NA SUSP: 2.0000 | Freq: Every day | NASAL | 6 refills | Status: DC

## 2021-09-23 NOTE — Progress Notes (Signed)
This visit occurred during the SARS-CoV-2 public health emergency.  Safety protocols were in place, including screening questions prior to the visit, additional usage of staff PPE, and extensive cleaning of exam room while observing appropriate contact time as indicated for disinfecting solutions.    Holly Medina , 08-19-1981, 40 y.o., female MRN: [id number] Patient Care Team    Relationship Specialty Notifications Start End  Natalia Leatherwood, DO PCP - General Family Medicine  11/06/15   Olivia Mackie, MD Consulting Physician Obstetrics and Gynecology  05/03/17     Chief Complaint  Patient presents with   Otalgia    Pt c/o L ear pain and fullness x 2 days;      Subjective: Pt presents for an OV with complaints of left ear fullness and decreased hearing. She reports 2 days ago she had water stuck in her ear canal and she stuck her finger in her ear to remove water. She states it made a suction when she went to remove her finger and she heard a pop. Since that time she has decreased hearing and fullness.   Depression screen [facility] 2/9 09/23/2021 04/10/2021 11/12/2020 07/21/2020 03/12/2020  Decreased Interest 0 0 0 0 0  Down, Depressed, Hopeless 0 0 0 0 0  PHQ - 2 Score 0 0 0 0 0  Altered sleeping - 0 - 0 -  Tired, decreased energy - 0 - 0 -  Change in appetite - 0 - 0 -  Feeling bad or failure about yourself  - 0 - 0 -  Trouble concentrating - 0 - 0 -  Moving slowly or fidgety/restless - 0 - 0 -  Suicidal thoughts - 0 - 0 -  PHQ-9 Score - 0 - 0 -    No Known Allergies Social History   Social History Narrative   Married. Husband's name is Jill Alexanders. Has 3 children, Zenovia Jordan of science degree. 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   Drink caffeinated beverages.   Wears her seatbelt, exercises at least 3 times a week.   Smoke detector in the home. Firearms in the home, and a locked cabinet.   Patient feels safe  at her relationships.   Past Medical History:  Diagnosis Date   Abnormal Pap smear of cervix 10/2006   HGSIL (dr. Merilynn Finland GYN)   Allergy    seasonal   Anxiety    Asthma    Blood type B+    Depression    Hemorrhoid    History of chicken pox    Postpartum care following vaginal delivery (11/20) 10/20/2011   Past Surgical History:  Procedure Laterality Date   COLPOSCOPY  2009   benign   Family History  Problem Relation Age of Onset   Asthma Mother    Hypertension Father    Multiple sclerosis Maternal Grandmother    Cancer Maternal Grandfather        lung   Cancer Paternal Uncle        lung cancer/smoker   COPD Paternal Grandfather    Colon cancer Neg Hx    Breast cancer Neg Hx    Allergies as of 09/23/2021   No Known Allergies      Medication List        Accurate as of September 23, 2021  2:24 PM. If you have any questions, ask your nurse or doctor.          amoxicillin-clavulanate 875-125  MG tablet Commonly known as: AUGMENTIN Take 1 tablet by mouth 2 (two) times daily. Started by: Felix Pacini, DO   fluticasone 50 MCG/ACT nasal spray Commonly known as: FLONASE Place 2 sprays into both nostrils daily. Started by: Felix Pacini, DO   hydrOXYzine 25 MG capsule Commonly known as: VISTARIL Take 25 mg by mouth daily as needed.   levonorgestrel 20 MCG/24HR IUD Commonly known as: MIRENA 1 each by Intrauterine route once.   sertraline 100 MG tablet Commonly known as: ZOLOFT Take 200 mg by mouth daily.        All past medical history, surgical history, allergies, family history, immunizations andmedications were updated in the EMR today and reviewed under the history and medication portions of their EMR.     ROS: Negative, with the exception of above mentioned in HPI   Objective:  BP 108/79   Pulse (!) 55   Temp 97.6 F (36.4 C) (Oral)   Wt 156 lb (70.8 kg)   SpO2 100%   BMI 27.20 kg/m  Body mass index is 27.2 kg/m. Gen: Afebrile. No  acute distress. Nontoxic in appearance, well developed, well nourished.  HENT: AT. . Right TM with mild effusion, no erythema. Left TM with small perforation and mild redness surround perf w/ broken vessel.  Eyes:Pupils Equal Round Reactive to light, Extraocular movements intact,  Conjunctiva without redness, discharge or icterus. Neuro: Normal gait. PERLA. EOMi. Alert. Oriented x3  Psych: Normal affect, dress and demeanor. Normal speech. Normal thought content and judgment.  No results found. No results found. No results found for this or any previous visit (from the past 24 hour(s)).  Assessment/Plan: KYEISHA JANOWICZ is a 40 y.o. female present for OV for  Central perforation of tympanic membrane of left ear Very small perforation over central TM over ossicle. Keep all water away from TM next 2-4 weeks. No standing water emersion recommended.  Avoid qtips. Nsaids for comfort.  Augmentin BID x7 days. To be safe Recheck in 2 weeks. Sooner if worsening.   Influenza vaccine recommended  Reviewed expectations re: course of current medical issues. Discussed self-management of symptoms. Outlined signs and symptoms indicating need for more acute intervention. Patient verbalized understanding and all questions were answered. Patient received an After-Visit Summary.    Orders Placed This Encounter  Procedures   Flu Vaccine QUAD 6+ mos PF IM (Fluarix Quad PF)   Meds ordered this encounter  Medications   fluticasone (FLONASE) 50 MCG/ACT nasal spray    Sig: Place 2 sprays into both nostrils daily.    Dispense:  16 g    Refill:  6   amoxicillin-clavulanate (AUGMENTIN) 875-125 MG tablet    Sig: Take 1 tablet by mouth 2 (two) times daily.    Dispense:  14 tablet    Refill:  0   Referral Orders  No referral(s) requested today     Note is dictated utilizing voice recognition software. Although note has been proof read prior to signing, occasional typographical errors still  can be missed. If any questions arise, please do not hesitate to call for verification.   electronically signed by:  Felix Pacini, DO  [facility] [place]

## 2021-09-23 NOTE — Patient Instructions (Signed)
Great to see you today.  I have refilled the medication(s) we provide.   If labs were collected, we will inform you of lab results once received either by echart message or telephone call.   - echart message- for normal results that have been seen by the patient already.   - telephone call: abnormal results or if patient has not viewed results in their echart.   Eardrum Rupture, Adult An eardrum rupture is a hole (perforation) in the eardrum. The eardrum is a thin, round tissue inside of the ear that separates the ear canal from the middle ear. The eardrum is also called the tympanic membrane. It transfers sound vibrations through small bones in the middle ear to the hearing nerve in the inner ear. It also protects the middle ear from germs. An eardrum rupture can cause pain and hearing loss. What are the causes? This condition may be caused by: An infection. A sudden injury, such as from: Inserting a thin, sharp object into the ear. A hit to the side of the head, especially by an open hand. Falling onto water or a flat surface. A rapid change in pressure, such as from flying or scuba diving. A sudden increase in pressure against the eardrum, such as from an explosion or a very loud noise. Inserting a cotton-tipped swab in the ear. A long-term eustachian tube disorder. Eustachian tubes are parts of the body that connect each middle ear space to the back of the nose. A medical procedure or surgery, such as a procedure to remove wax from the ear canal. Removing a pressure equalization tube(PE tube) that was surgically placed through the eardrum. Having a PE tube fall out. What increases the risk? You are more likely to develop this condition if: You have had PE tubes inserted in your ears. You have an ear infection. You play sports that: Involve balls or contact with other players. Take place in water, such as diving, scuba diving, or waterskiing. What are the signs or  symptoms? Symptoms of this condition include: Sudden pain at the time of the injury. Ear pain that suddenly improves. Ringing in the ear after the injury. Drainage from the ear. The drainage may be clear, cloudy or pus-like, or bloody. Hearing loss. Dizziness. How is this diagnosed? This condition is diagnosed based on your symptoms and medical history as well as a physical exam. Your health care provider can usually see a perforation using an ear scope (otoscope). You may have tests, such as: A hearing test (audiogram) to check for hearing loss. A test in which a sample of ear drainage is tested for infection (culture). How is this treated? An eardrum typically heals on its own within a few weeks. If your eardrum does not heal, your health care provider may recommend a procedure to place a patch over your eardrum or surgery to repair your eardrum. Your health care provider may also prescribe antibiotic medicines to help prevent infection. If the ear heals completely, any hearing loss should be temporary. Follow these instructions at home: Medicines Take over-the-counter and prescription medicines only as told by your health care provider. If you were prescribed an antibiotic medicine, use it as told by your health care provider. Do not stop using the antibiotic even if you start to feel better. Ear care Keep your ear dry. This is very important. Follow instructions from your health care provider about how to keep your ear dry. You may need to wear waterproof earplugs when bathing and swimming.  If directed, apply heat to your affected ear as often as told by your health care provider. Use the heat source that your health care provider recommends, such as a moist heat pack or a heating pad. This will help to relieve pain. Place a towel between your skin and the heat source. Leave the heat on for 20-30 minutes. Remove the heat if your skin turns bright red. This is especially important if you  are unable to feel pain, heat, or cold. You have a greater risk of getting burned. General instructions Return to sports and activities as told by your health care provider. Ask your health care provider what activities are safe for you. Wear headgear with ear protection when you play sports in which ear injuries are common. Talk to your health care provider before traveling by plane. Keep all follow-up visits. This is important. Contact a health care provider if: You have a fever. You have ear pain. You have mucus or blood draining from your ear. You have hearing loss, dizziness, or ringing in your ear. Get help right away if: You have sudden hearing loss. You are very dizzy. You have severe ear pain. Your face feels weak or becomes limp (paralyzed). These symptoms may represent a serious problem that is an emergency. Do not wait to see if the symptoms will go away. Get medical help right away. Call your local emergency services (911 in the [place] Do not drive yourself to the hospital. Summary An eardrum rupture is a hole (perforation) in the eardrum that can cause pain and hearing loss. It is usually caused by a sudden injury to the ear. The eardrum will likely heal on its own within a few weeks. In some cases, surgery may be necessary. Follow instructions from your health care provider about how to keep your ear dry as it heals. This information is not intended to replace advice given to you by your health care provider. Make sure you discuss any questions you have with your health care provider. Document Revised: 10/06/2020 Document Reviewed: 10/06/2020 Elsevier Patient Education  2022 [employer].

## 2022-04-06 IMAGING — CR DG THORACIC SPINE 3V
3 series · 3 of 3 positions shown · non-contrast
Comparison: None.

CLINICAL DATA: Left rib end subscapular pain

EXAM:
THORACIC SPINE - 3 VIEWS

[w t-spine a.p. *]
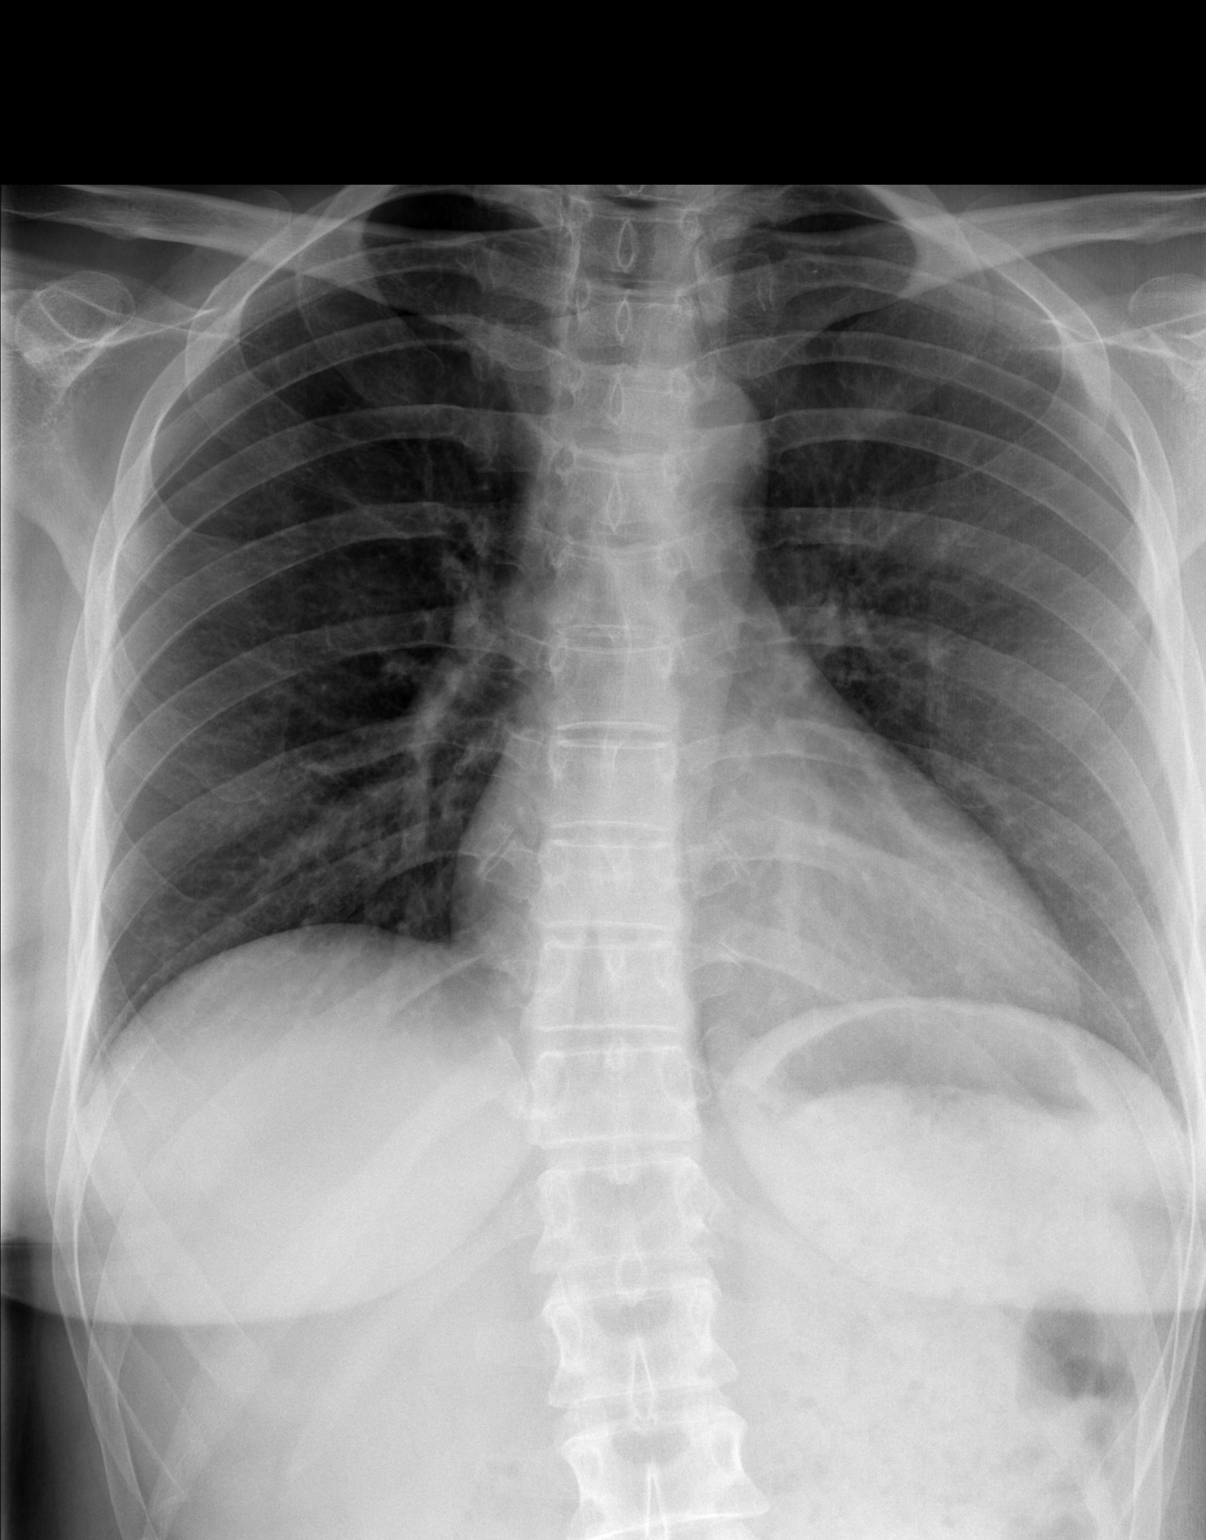

[w t-spine lat *]
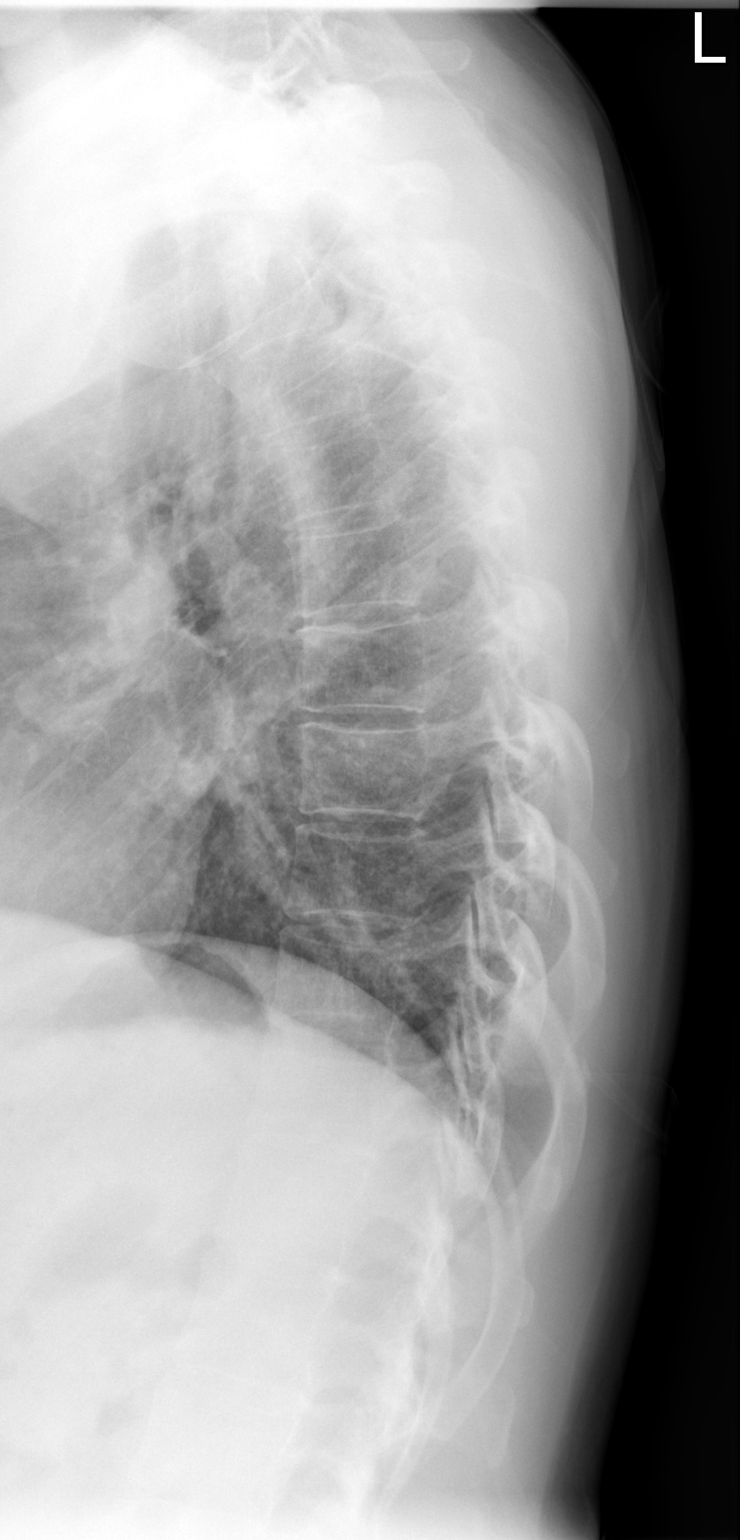

[w swimmers view]
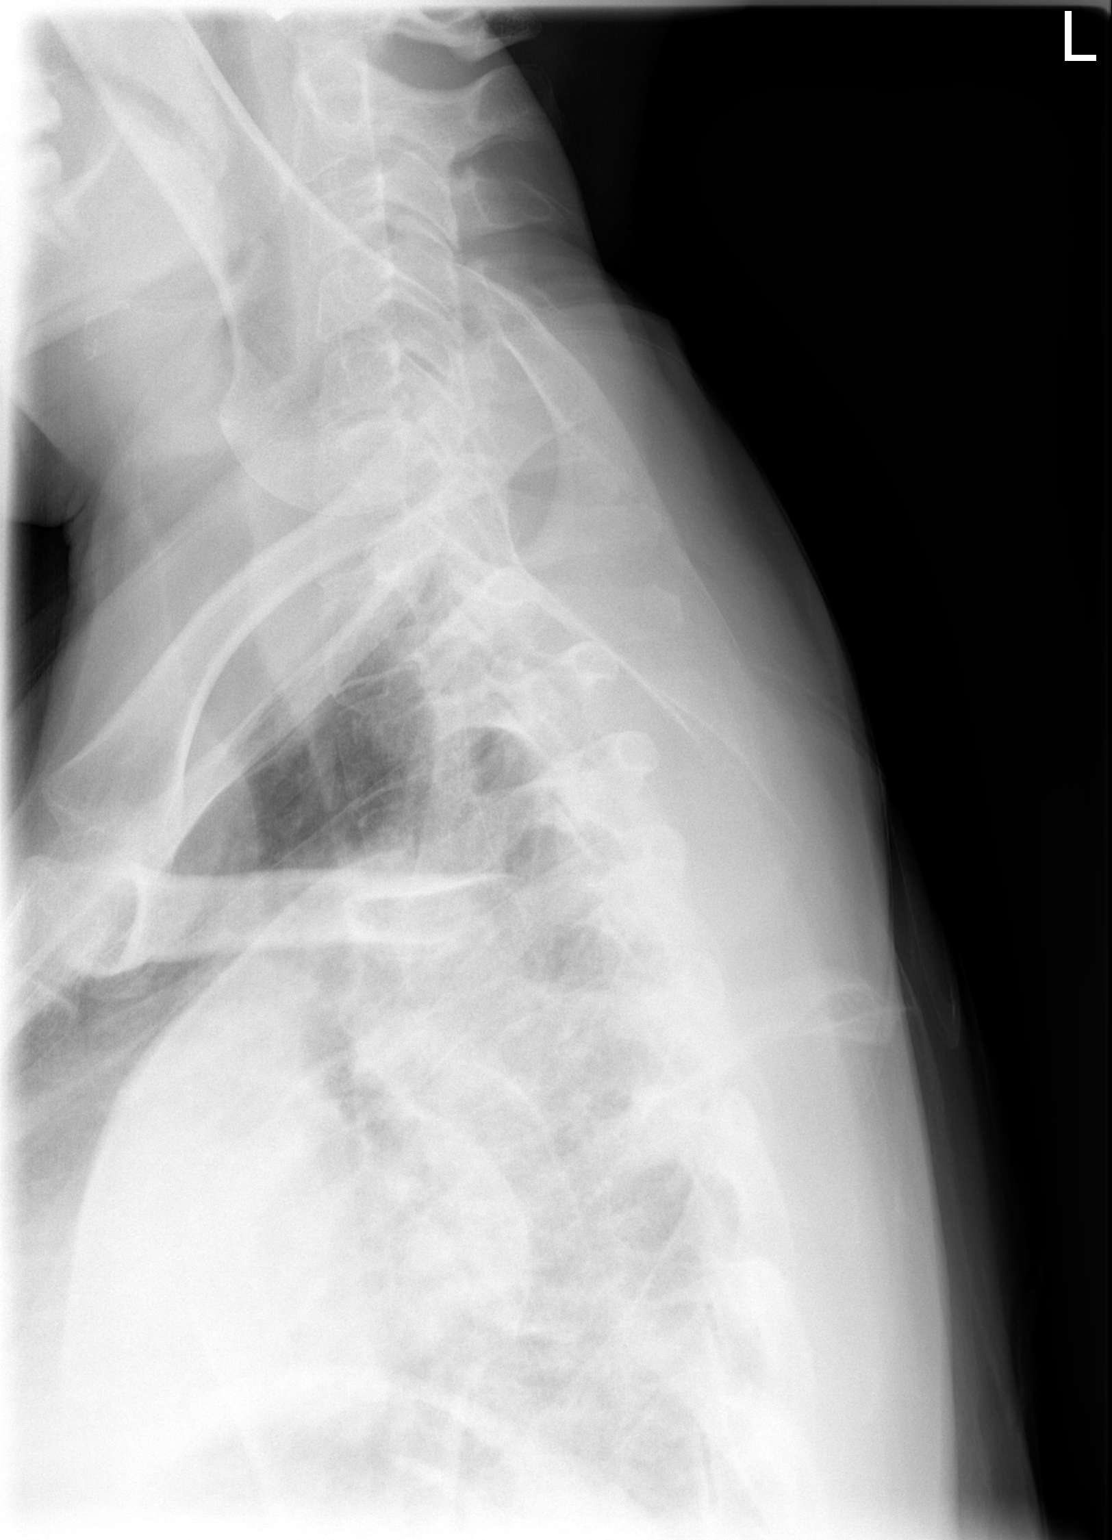

[3 of 3 positions shown; findings below may reference images not displayed]

FINDINGS: Mild dextrocurvature of the thoracolumbar spine. Sagittal alignment
is within normal limits. Disc spaces are within normal limits.
IMPRESSION: Negative.

## 2023-08-01 ENCOUNTER — Emergency Department (HOSPITAL_COMMUNITY)
Admission: EM | Admit: 2023-08-01 | Discharge: 2023-08-01 | Disposition: A | Discharge status: Home / Self Care | Payer: BC Managed Care – PPO | Attending: Emergency Medicine | Admitting: Emergency Medicine

## 2023-08-01 ENCOUNTER — Emergency Department (HOSPITAL_COMMUNITY): Payer: BC Managed Care – PPO

## 2023-08-01 ENCOUNTER — Other Ambulatory Visit: Payer: Self-pay

## 2023-08-01 ENCOUNTER — Encounter (HOSPITAL_COMMUNITY): Payer: Self-pay

## 2023-08-01 DIAGNOSIS — M545 Low back pain, unspecified: Secondary | ICD-10-CM | POA: Diagnosis present

## 2023-08-01 DIAGNOSIS — S29012A Strain of muscle and tendon of back wall of thorax, initial encounter: Secondary | ICD-10-CM | POA: Insufficient documentation

## 2023-08-01 DIAGNOSIS — X58XXXA Exposure to other specified factors, initial encounter: Secondary | ICD-10-CM | POA: Diagnosis not present

## 2023-08-01 DIAGNOSIS — J45909 Unspecified asthma, uncomplicated: Secondary | ICD-10-CM | POA: Insufficient documentation

## 2023-08-01 LAB — BASIC METABOLIC PANEL
Anion gap: 7 (ref 5–15)
BUN: 15 mg/dL (ref 6–20)
CO2: 22 mmol/L (ref 22–32)
Calcium: 8.8 mg/dL — ABNORMAL LOW (ref 8.9–10.3)
Chloride: 107 mmol/L (ref 98–111)
Creatinine, Ser: 0.68 mg/dL (ref 0.44–1.00)
GFR, Estimated: >60 mL/min (ref >60)
Glucose, Bld: 108 mg/dL — ABNORMAL HIGH (ref 70–99)
Potassium: 4 mmol/L (ref 3.5–5.1)
Sodium: 136 mmol/L (ref 135–145)

## 2023-08-01 LAB — CBC WITH DIFFERENTIAL/PLATELET
Abs Immature Granulocytes: 0.02 10*3/uL (ref 0.00–0.07)
Basophils Absolute: 0 10*3/uL (ref 0.0–0.1)
Basophils Relative: 1 %
Eosinophils Absolute: 0.1 10*3/uL (ref 0.0–0.5)
Eosinophils Relative: 2 %
HCT: 42 % (ref 36.0–46.0)
Hemoglobin: 14 g/dL (ref 12.0–15.0)
Immature Granulocytes: 0 %
Lymphocytes Relative: 12 %
Lymphs Abs: 0.9 10*3/uL (ref 0.7–4.0)
MCH: 31 pg (ref 26.0–34.0)
MCHC: 33.3 g/dL (ref 30.0–36.0)
MCV: 92.9 fL (ref 80.0–100.0)
Monocytes Absolute: 0.3 10*3/uL (ref 0.1–1.0)
Monocytes Relative: 4 %
Neutro Abs: 6.2 10*3/uL (ref 1.7–7.7)
Neutrophils Relative %: 81 %
Platelets: 292 10*3/uL (ref 150–400)
RBC: 4.52 MIL/uL (ref 3.87–5.11)
RDW: 11.9 % (ref 11.5–15.5)
WBC: 7.7 10*3/uL (ref 4.0–10.5)
nRBC: 0 % (ref 0.0–0.2)

## 2023-08-01 LAB — D-DIMER, QUANTITATIVE: D-Dimer, Quant: 0.27 ug{FEU}/mL (ref 0.00–0.50)

## 2023-08-01 LAB — TROPONIN I (HIGH SENSITIVITY): Troponin I (High Sensitivity): <2 ng/L (ref <18)

## 2023-08-01 MED ORDER — METHOCARBAMOL 500 MG PO TABS
1000.0000 mg | ORAL_TABLET | Freq: Once | ORAL | Status: AC
  Administered 2023-08-01: 1000 mg via ORAL
  Filled 2023-08-01: qty 2

## 2023-08-01 MED ORDER — METHOCARBAMOL 1000 MG/10ML IJ SOLN
1000.0000 mg | Freq: Once | INTRAMUSCULAR | Status: DC
Start: 2023-08-01 — End: 2023-08-01

## 2023-08-01 MED ORDER — KETOROLAC TROMETHAMINE 15 MG/ML IJ SOLN
15.0000 mg | Freq: Once | INTRAMUSCULAR | Status: AC
  Administered 2023-08-01: 15 mg via INTRAVENOUS
  Filled 2023-08-01: qty 1

## 2023-08-01 MED ORDER — CYCLOBENZAPRINE HCL 10 MG PO TABS: 10.0000 mg | ORAL_TABLET | Freq: Two times a day (BID) | ORAL | 0 refills | Status: DC | PRN

## 2023-08-01 NOTE — ED Provider Notes (Signed)
[place] EMERGENCY DEPARTMENT AT [facility] Provider Note  CSN: [id number] Arrival date & time: 08/01/23 1013  Chief Complaint(s) Back Pain  HPI Holly Medina is a 42 y.o. female no significant past medical history presenting to the emergency department with back pain.  Patient reports bilateral upper back pain, describes as pleuritic, also feels some shortness of breath.  No fevers or chills.  No nausea or vomiting, no cough, no leg swelling or recent travel, no chest pain, denies similar in the past.  No back trauma.  No numbness or tingling.  No weakness.   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 Finland GYN)   Allergy    seasonal   Anxiety    Asthma    Blood type B+    Depression    Hemorrhoid    History of chicken pox    Postpartum care following vaginal delivery (11/20) 10/20/2011   Patient Active Problem List   Diagnosis Date Noted   Scapular dyskinesis 12/05/2020   Nonallopathic lesion of thoracic region 12/05/2020   Dextroscoliosis of thoracic spine 11/12/2020   Overweight (BMI 25.0-29.9) 03/12/2020   Long term current use of hormonal contraceptive 03/12/2020   IUD (intrauterine device) in place 05/03/2017   Depression with anxiety 12/03/2010   Home Medication(s) Prior to Admission medications   Medication Sig Start Date End Date Taking? Authorizing Provider  cyclobenzaprine (FLEXERIL) 10 MG tablet Take 1 tablet (10 mg total) by mouth 2 (two) times daily as needed for muscle spasms. 08/01/23  Yes Lonell Grandchild, MD  amoxicillin-clavulanate (AUGMENTIN) 875-125 MG tablet Take 1 tablet by mouth 2 (two) times daily. 09/23/21   Kuneff, Renee A, DO  fluticasone (FLONASE) 50 MCG/ACT nasal spray Place 2 sprays into both nostrils daily. 09/23/21   Kuneff, Renee A, DO  hydrOXYzine (VISTARIL) 25 MG capsule Take 25 mg by mouth daily as needed. 11/11/20   [provider]   levonorgestrel (MIRENA) 20 MCG/24HR IUD 1 each by Intrauterine route once. 08/30/19   [provider]  sertraline (ZOLOFT) 100 MG tablet Take 200 mg by mouth daily.    [provider]                                                                                                                                    Past Surgical History Past Surgical History:  Procedure Laterality Date   COLPOSCOPY  2009   benign   Family History Family History  Problem Relation Age of Onset   Asthma Mother    Hypertension Father    Multiple sclerosis Maternal Grandmother    Cancer Maternal Grandfather        lung   Cancer Paternal Uncle        lung cancer/smoker   COPD Paternal Grandfather    Colon cancer Neg Hx  Breast cancer Neg Hx     Social History Social History   Tobacco Use   Smoking status: Former    Current packs/day: 0.00    Types: Cigarettes    Quit date: 11/29/2002    Years since quitting: 20.6   Smokeless tobacco: Never  Vaping Use   Vaping status: Never Used  Substance Use Topics   Alcohol use: Yes    Alcohol/week: 1.0 standard drink of alcohol    Types: 1 Standard drinks or equivalent per week   Drug use: No   Allergies Patient has no known allergies.  Review of Systems Review of Systems  All other systems reviewed and are negative.   Physical Exam Vital Signs  I have reviewed the triage vital signs BP 128/76   Pulse 85   Temp 98.3 F (36.8 C) (Oral)   Resp 18   Ht 5\' 4"  (1.626 m)   Wt 74.8 kg   SpO2 95%   BMI 28.32 kg/m  Physical Exam Vitals and nursing note reviewed.  Constitutional:      General: She is not in acute distress.    Appearance: She is well-developed.  HENT:     Head: Normocephalic and atraumatic.     Mouth/Throat:     Mouth: Mucous membranes are moist.  Eyes:     Pupils: Pupils are equal, round, and reactive to light.  Cardiovascular:     Rate and Rhythm: Normal rate and regular rhythm.     Heart sounds: No  murmur heard. Pulmonary:     Effort: Pulmonary effort is normal. No respiratory distress.     Breath sounds: Normal breath sounds.  Abdominal:     General: Abdomen is flat.     Palpations: Abdomen is soft.     Tenderness: There is no abdominal tenderness.  Musculoskeletal:        General: No tenderness.     Right lower leg: No edema.     Left lower leg: No edema.  Skin:    General: Skin is warm and dry.  Neurological:     General: No focal deficit present.     Mental Status: She is alert. Mental status is at baseline.  Psychiatric:        Mood and Affect: Mood normal.        Behavior: Behavior normal.     ED Results and Treatments Labs (all labs ordered are listed, but only abnormal results are displayed) Labs Reviewed  BASIC METABOLIC PANEL - Abnormal; Notable for the following components:      Result Value   Glucose, Bld 108 (*)    Calcium 8.8 (*)    All other components within normal limits  CBC WITH DIFFERENTIAL/PLATELET  D-DIMER, QUANTITATIVE  TROPONIN I (HIGH SENSITIVITY)                                                                                                                          Radiology DG Chest 2 View  Result Date: 08/01/2023 CLINICAL DATA:  Shortness of breath.  Chest pain and pressure. EXAM: CHEST - 2 VIEW COMPARISON:  None Available. FINDINGS: The heart size and mediastinal contours are within normal limits. Both lungs are clear. The visualized skeletal structures are unremarkable. IMPRESSION: No active cardiopulmonary disease. Electronically Signed   By: Danae Orleans M.D.   On: 08/01/2023 10:46    Pertinent labs & imaging results that were available during my care of the patient were reviewed by me and considered in my medical decision making (see MDM for details).  Medications Ordered in ED Medications  ketorolac (TORADOL) 15 MG/ML injection 15 mg (15 mg Intravenous Given 08/01/23 1145)  methocarbamol (ROBAXIN) tablet 1,000 mg (1,000 mg Oral Given  08/01/23 1139)                                                                                                                                     Procedures Procedures  (including critical care time)  Medical Decision Making / ED Course   MDM:  42 year old presenting to the emergency department with back pain.  Patient overall well-appearing, physical exam with no focal abnormalities.  No specific tenderness to back musculature.  No midline tenderness.  Suspect most likely cause is still probably a muscle strain, worse with movement and breathing.  However given pleuritic nature of pain will check D-dimer to rule out PE.  Doubt any cardiac cause.  Chest x-ray clear with no rib fracture or pneumothorax.  Will treat pain with Toradol and Robaxin and reassess.  Clinical Course as of 08/01/23 1301  Mon Aug 01, 2023  1256 Labs reassuring including negative d-dimer, troponin. Patient does feel better. Will discharge patient to home. All questions answered. Patient comfortable with plan of discharge. Return precautions discussed with patient and specified on the after visit summary.  [WS]    Clinical Course User Index [WS] Lonell Grandchild, MD     Additional history obtained: -Additional history obtained from spouse   Lab Tests: -I ordered, reviewed, and interpreted labs.   The pertinent results include:   Labs Reviewed  BASIC METABOLIC PANEL - Abnormal; Notable for the following components:      Result Value   Glucose, Bld 108 (*)    Calcium 8.8 (*)    All other components within normal limits  CBC WITH DIFFERENTIAL/PLATELET  D-DIMER, QUANTITATIVE  TROPONIN I (HIGH SENSITIVITY)    Notable for mild hyperglycemia, nonspecific   EKG   EKG Interpretation Date/Time:  Monday August 01 2023 10:18:02 EDT Ventricular Rate:  68 PR Interval:  174 QRS Duration:  84 QT Interval:  404 QTC Calculation: 430 R Axis:   72  Text Interpretation: Sinus rhythm Borderline T  abnormalities, anterior leads Confirmed by Alvino Blood ([id number]) on 08/01/2023 11:09:10 AM         Imaging Studies ordered: I ordered imaging studies including CXR On my interpretation imaging demonstrates no  acute process I independently visualized and interpreted imaging. I agree with the radiologist interpretation   Medicines ordered and prescription drug management: Meds ordered this encounter  Medications   ketorolac (TORADOL) 15 MG/ML injection 15 mg   DISCONTD: methocarbamol (ROBAXIN) injection 1,000 mg   methocarbamol (ROBAXIN) tablet 1,000 mg   cyclobenzaprine (FLEXERIL) 10 MG tablet    Sig: Take 1 tablet (10 mg total) by mouth 2 (two) times daily as needed for muscle spasms.    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    Final Clinical Impression(s) / ED Diagnoses Final diagnoses:  Strain of thoracic back region     This chart was dictated using voice recognition software.  Despite best efforts to proofread,  errors can occur which can change the documentation meaning.    Lonell Grandchild, MD 08/01/23 [phone]

## 2023-08-01 NOTE — ED Triage Notes (Signed)
Patient went to bed with upper back pain and then woke up this morning with trouble breathing. Feels like she pulled a muscle. Feels like "something is sitting on her lungs."

## 2023-08-01 NOTE — Discharge Instructions (Addendum)
We evaluated you for your back pain.  Your testing was reassuring including x-rays and lab tests.  I believe the most likely cause of your symptoms is a muscular strain.  Please take Tylenol and Motrin for your symptoms at home.  You can take 1000 mg of Tylenol every 6 hours and 600 mg of ibuprofen every 6 hours as needed for your symptoms.  You can take these medicines together as needed, either at the same time, or alternating every 3 hours.  I have also prescribed you a muscle relaxer.  Do not mix this medication with alcohol as it may make you drowsy.  Please also do not drive while taking this medication.  Please try to maintain your normal activities, as lying in bed may make your symptoms worse or slower to improve.  Please follow-up with your primary doctor.  Please return if you have any new or worsening symptoms such as shortness of breath, fevers or chills, numbness or tingling, weakness, or any other new symptoms.

## 2024-01-24 ENCOUNTER — Other Ambulatory Visit: Payer: Self-pay | Admitting: Family Medicine

## 2024-10-18 ENCOUNTER — Other Ambulatory Visit: Payer: Self-pay

## 2024-10-18 ENCOUNTER — Ambulatory Visit
Admission: EM | Admit: 2024-10-18 | Discharge: 2024-10-18 | Disposition: A | Discharge status: Home / Self Care | Attending: Family Medicine | Admitting: Family Medicine

## 2024-10-18 DIAGNOSIS — J029 Acute pharyngitis, unspecified: Secondary | ICD-10-CM

## 2024-10-18 DIAGNOSIS — J020 Streptococcal pharyngitis: Secondary | ICD-10-CM

## 2024-10-18 LAB — POCT RAPID STREP A (OFFICE): Rapid Strep A Screen: POSITIVE — AB

## 2024-10-18 MED ORDER — FLUCONAZOLE 200 MG PO TABS: ORAL_TABLET | ORAL | 0 refills | Status: AC

## 2024-10-18 MED ORDER — PREDNISONE 20 MG PO TABS: ORAL_TABLET | ORAL | 0 refills | Status: AC

## 2024-10-18 MED ORDER — AMOXICILLIN-POT CLAVULANATE 875-125 MG PO TABS: 1.0000 | ORAL_TABLET | Freq: Two times a day (BID) | ORAL | 0 refills | Status: AC

## 2024-10-18 NOTE — ED Triage Notes (Addendum)
 Right ear, throat pain started 2 nights ago. Was recently on amoxicillin  for sinus infection a few weeks ago but did not complete d/t getting a yeast infection. Not having current issues with yeast infection. No fever. Is teacher. Reports she feels drainage in back of throat. Thinks she has an ear infection. Hard to sleep at night. Has had ibuprofen  and tylenol . Has had kids with strep in class, wants testing.

## 2024-10-18 NOTE — Discharge Instructions (Addendum)
 Advised patient to take medications as directed with food to completion.  Advised to take prednisone  with first dose of Augmentin  for the next 5 of 7 days.  Advised may use Diflucan  for Candida vaginitis if necessary.  Encouraged increase daily water intake to 64 ounces per day while taking these medications.  Advised if symptoms worsen and/or unresolved please follow-up with PCP or for further evaluation.

## 2024-10-18 NOTE — ED Provider Notes (Signed)
 [facility]    CSN: [id number] Arrival date & time: 10/18/24  1627      History   Chief Complaint Chief Complaint  Patient presents with   Sore Throat    HPI Holly Medina is a 43 y.o. female.    HPI 43 year old female presents with right ear pain.  Patient reports was recently placed on amoxicillin  for sinus infection 3 weeks ago; however, did not complete antibiotic due to getting yeast infection.  Patient reports that she is a runner, broadcasting/film/video.  PMH significant for scapular dyskinesis, nonallopathic lesion of thoracic region, and anxiety.  Past Medical History:  Diagnosis Date   Abnormal Pap smear of cervix 10/2006   HGSIL (dr. marina GYN)   Allergy    seasonal   Anxiety    Asthma    Blood type B+    Depression    Hemorrhoid    History of chicken pox    Postpartum care following vaginal delivery (11/20) 10/20/2011    Patient Active Problem List   Diagnosis Date Noted   Scapular dyskinesis 12/05/2020   Nonallopathic lesion of thoracic region 12/05/2020   Dextroscoliosis of thoracic spine 11/12/2020   Overweight (BMI 25.0-29.9) 03/12/2020   Long term current use of hormonal contraceptive 03/12/2020   IUD (intrauterine device) in place 05/03/2017   Depression with anxiety 12/03/2010    Past Surgical History:  Procedure Laterality Date   COLPOSCOPY  2009   benign    OB History     Gravida  3   Para  3   Term  3   Preterm  0   AB  0   Living  3      SAB  0   IAB  0   Ectopic  0   Multiple  0   Live Births  2            Home Medications    Prior to Admission medications   Medication Sig Start Date End Date Taking? Authorizing Provider  amoxicillin -clavulanate (AUGMENTIN ) 875-125 MG tablet Take 1 tablet by mouth every 12 (twelve) hours. 10/18/24  Yes Teddy Sharper, FNP  fluconazole (DIFLUCAN) 200 MG tablet Take 1 tab p.o. now, may repeat 1 tab p.o. in 3 days if symptoms are not resolved. 10/18/24  Yes Teddy Sharper, FNP  predniSONE (DELTASONE) 20 MG tablet Take 3 tabs PO daily x 5 days. 10/18/24  Yes Teddy Sharper, FNP  hydrOXYzine (VISTARIL) 25 MG capsule Take 25 mg by mouth daily as needed. 11/11/20   [provider]  levonorgestrel (MIRENA) 20 MCG/24HR IUD 1 each by Intrauterine route once. 08/30/19   [provider]  sertraline  (ZOLOFT ) 100 MG tablet Take 200 mg by mouth daily.    [provider]    Family History Family History  Problem Relation Age of Onset   Asthma Mother    Hypertension Father    Multiple sclerosis Maternal Grandmother    Cancer Maternal Grandfather        lung   Cancer Paternal Uncle        lung cancer/smoker   COPD Paternal Grandfather    Colon cancer Neg Hx    Breast cancer Neg Hx     Social History Social History   Tobacco Use   Smoking status: Former    Current packs/day: 0.00    Types: Cigarettes    Quit date: 11/29/2002    Years since quitting: 21.9   Smokeless tobacco: Never  Vaping Use  Vaping status: Never Used  Substance Use Topics   Alcohol use: Yes    Alcohol/week: 1.0 standard drink of alcohol    Types: 1 Standard drinks or equivalent per week   Drug use: No     Allergies   Patient has no known allergies.   Review of Systems Review of Systems   Physical Exam Triage Vital Signs ED Triage Vitals  Encounter Vitals Group     BP      Girls Systolic BP Percentile      Girls Diastolic BP Percentile      Boys Systolic BP Percentile      Boys Diastolic BP Percentile      Pulse      Resp      Temp      Temp src      SpO2      Weight      Height      Head Circumference      Peak Flow      Pain Score      Pain Loc      Pain Education      Exclude from Growth Chart    No data found.  Updated Vital Signs BP 122/84   Pulse 78   Temp 98.1 F (36.7 C)   Resp 16   LMP 10/15/2024 (Exact Date)   SpO2 99%   Visual Acuity Right Eye Distance:   Left Eye Distance:   Bilateral Distance:     Right Eye Near:   Left Eye Near:    Bilateral Near:     Physical Exam Vitals and nursing note reviewed.  Constitutional:      Appearance: Normal appearance. She is normal weight. She is ill-appearing.  HENT:     Head: Normocephalic and atraumatic.     Right Ear: Tympanic membrane, ear canal and external ear normal.     Left Ear: Tympanic membrane, ear canal and external ear normal.     Mouth/Throat:     Mouth: Mucous membranes are moist.     Pharynx: Oropharynx is clear. Uvula midline. Posterior oropharyngeal erythema and uvula swelling present.     Tonsils: 3+ on the right. 3+ on the left.  Eyes:     Extraocular Movements: Extraocular movements intact.     Conjunctiva/sclera: Conjunctivae normal.     Pupils: Pupils are equal, round, and reactive to light.  Cardiovascular:     Rate and Rhythm: Normal rate and regular rhythm.     Heart sounds: Normal heart sounds.  Pulmonary:     Effort: Pulmonary effort is normal.     Breath sounds: Normal breath sounds. No wheezing, rhonchi or rales.  Musculoskeletal:        General: Normal range of motion.     Cervical back: No tenderness.  Lymphadenopathy:     Cervical: Cervical adenopathy present.  Skin:    General: Skin is warm and dry.  Neurological:     General: No focal deficit present.     Mental Status: She is alert and oriented to person, place, and time. Mental status is at baseline.  Psychiatric:        Mood and Affect: Mood normal.        Behavior: Behavior normal.      UC Treatments / Results  Labs (all labs ordered are listed, but only abnormal results are displayed) Labs Reviewed  POCT RAPID STREP A (OFFICE) - Abnormal; Notable for the following components:  Result Value   Rapid Strep A Screen Positive (*)    All other components within normal limits    EKG   Radiology No results found.  Procedures Procedures (including critical care time)  Medications Ordered in UC Medications - No data to  display  Initial Impression / Assessment and Plan / UC Course  I have reviewed the triage vital signs and the nursing notes.  Pertinent labs & imaging results that were available during my care of the patient were reviewed by me and considered in my medical decision making (see chart for details).     MDM: 1.  Strep pharyngitis-Rx'd Augmentin  875/125 mg tablet: Take 1 tablet twice daily x 7 days; 2.  Sore throat-Rx'd prednisone 20 mg tablet: Take 3 tablets p.o. daily x 5 days. Advised patient to take medications as directed with food to completion.  Advised to take prednisone with first dose of Augmentin  for the next 5 of 7 days.  Advised may use Diflucan for Candida vaginitis if necessary.  Encouraged increase daily water intake to 64 ounces per day while taking these medications.  Advised if symptoms worsen and/or unresolved please follow-up with PCP or for further evaluation.  Work note provided to patient prior to discharge today.  Patient discharged home, hemodynamically stable. Final Clinical Impressions(s) / UC Diagnoses   Final diagnoses:  Strep pharyngitis  Sore throat     Discharge Instructions      Advised patient to take medications as directed with food to completion.  Advised to take prednisone with first dose of Augmentin  for the next 5 of 7 days.  Advised may use Diflucan for Candida vaginitis if necessary.  Encouraged increase daily water intake to 64 ounces per day while taking these medications.  Advised if symptoms worsen and/or unresolved please follow-up with PCP or for further evaluation.     ED Prescriptions     Medication Sig Dispense Auth. Provider   amoxicillin -clavulanate (AUGMENTIN ) 875-125 MG tablet Take 1 tablet by mouth every 12 (twelve) hours. 14 tablet Martavius Lusty, FNP   fluconazole (DIFLUCAN) 200 MG tablet Take 1 tab p.o. now, may repeat 1 tab p.o. in 3 days if symptoms are not resolved. 7 tablet Amanda Steuart, FNP   predniSONE (DELTASONE) 20 MG  tablet Take 3 tabs PO daily x 5 days. 15 tablet Osker Ayoub, FNP      PDMP not reviewed this encounter.   Teddy Sharper, FNP 10/18/24 [phone]
